# Patient Record
Sex: Female | Born: 1980 | Race: Black or African American | Hispanic: No | Marital: Single | Smoking: Never smoker
Health system: Southern US, Community
[De-identification: ages and names within clinical notes are randomized; demographics above are authoritative.]

## PROBLEM LIST (undated history)

## (undated) ENCOUNTER — Ambulatory Visit (HOSPITAL_COMMUNITY): Admission: EM | Discharge status: Absent without leave | Payer: Self-pay

## (undated) DIAGNOSIS — G4733 Obstructive sleep apnea (adult) (pediatric): Secondary | ICD-10-CM

## (undated) HISTORY — PX: TONSILLECTOMY: SUR1361

## (undated) HISTORY — PX: NASAL SEPTOPLASTY W/ TURBINOPLASTY: SHX2070

## (undated) HISTORY — DX: Obstructive sleep apnea (adult) (pediatric): G47.33

---
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
## 1998-01-06 ENCOUNTER — Inpatient Hospital Stay (HOSPITAL_COMMUNITY): Admission: AD | Admit: 1998-01-06 | Discharge: 1998-01-06 | Payer: Self-pay | Admitting: Obstetrics & Gynecology

## 1998-02-17 ENCOUNTER — Inpatient Hospital Stay (HOSPITAL_COMMUNITY): Admission: AD | Admit: 1998-02-17 | Discharge: 1998-02-17 | Payer: Self-pay | Admitting: *Deleted

## 1998-02-18 ENCOUNTER — Other Ambulatory Visit: Admission: RE | Admit: 1998-02-18 | Discharge: 1998-02-18 | Payer: Self-pay | Admitting: Obstetrics & Gynecology

## 1998-05-12 ENCOUNTER — Other Ambulatory Visit: Admission: RE | Admit: 1998-05-12 | Discharge: 1998-05-12 | Payer: Self-pay | Admitting: Obstetrics & Gynecology

## 1998-07-15 ENCOUNTER — Inpatient Hospital Stay (HOSPITAL_COMMUNITY): Admission: AD | Admit: 1998-07-15 | Discharge: 1998-07-15 | Payer: Self-pay | Admitting: *Deleted

## 1998-07-22 ENCOUNTER — Inpatient Hospital Stay (HOSPITAL_COMMUNITY): Admission: AD | Admit: 1998-07-22 | Discharge: 1998-07-25 | Payer: Self-pay | Admitting: Obstetrics & Gynecology

## 1998-07-29 ENCOUNTER — Emergency Department (HOSPITAL_COMMUNITY): Admission: EM | Admit: 1998-07-29 | Discharge: 1998-07-29 | Payer: Self-pay | Admitting: *Deleted

## 1998-09-13 ENCOUNTER — Other Ambulatory Visit: Admission: RE | Admit: 1998-09-13 | Discharge: 1998-09-13 | Payer: Self-pay | Admitting: Obstetrics and Gynecology

## 1999-04-23 ENCOUNTER — Emergency Department (HOSPITAL_COMMUNITY): Admission: EM | Admit: 1999-04-23 | Discharge: 1999-04-23 | Payer: Self-pay | Admitting: Emergency Medicine

## 1999-05-09 ENCOUNTER — Other Ambulatory Visit: Admission: RE | Admit: 1999-05-09 | Discharge: 1999-05-09 | Payer: Self-pay | Admitting: Obstetrics & Gynecology

## 1999-11-15 ENCOUNTER — Ambulatory Visit (HOSPITAL_COMMUNITY): Admission: RE | Admit: 1999-11-15 | Discharge: 1999-11-15 | Payer: Self-pay | Admitting: *Deleted

## 2001-02-07 ENCOUNTER — Other Ambulatory Visit: Admission: RE | Admit: 2001-02-07 | Discharge: 2001-02-07 | Payer: Self-pay | Admitting: Obstetrics and Gynecology

## 2001-09-14 ENCOUNTER — Emergency Department (HOSPITAL_COMMUNITY): Admission: EM | Admit: 2001-09-14 | Discharge: 2001-09-14 | Payer: Self-pay | Admitting: Emergency Medicine

## 2001-10-07 ENCOUNTER — Emergency Department (HOSPITAL_COMMUNITY): Admission: EM | Admit: 2001-10-07 | Discharge: 2001-10-07 | Payer: Self-pay | Admitting: Emergency Medicine

## 2001-11-22 ENCOUNTER — Ambulatory Visit (HOSPITAL_COMMUNITY): Admission: RE | Admit: 2001-11-22 | Discharge: 2001-11-22 | Payer: Self-pay | Admitting: Obstetrics and Gynecology

## 2002-05-14 ENCOUNTER — Other Ambulatory Visit: Admission: RE | Admit: 2002-05-14 | Discharge: 2002-05-14 | Payer: Self-pay | Admitting: Obstetrics and Gynecology

## 2002-10-14 ENCOUNTER — Encounter: Admission: RE | Admit: 2002-10-14 | Discharge: 2003-01-12 | Payer: Self-pay | Admitting: Family Medicine

## 2004-11-01 ENCOUNTER — Ambulatory Visit: Payer: Self-pay | Admitting: Internal Medicine

## 2005-01-12 ENCOUNTER — Other Ambulatory Visit: Admission: RE | Admit: 2005-01-12 | Discharge: 2005-01-12 | Payer: Self-pay | Admitting: Obstetrics and Gynecology

## 2005-04-10 HISTORY — PX: TUBAL LIGATION: SHX77

## 2006-04-05 ENCOUNTER — Ambulatory Visit: Payer: Self-pay | Admitting: Internal Medicine

## 2006-04-12 ENCOUNTER — Encounter (INDEPENDENT_AMBULATORY_CARE_PROVIDER_SITE_OTHER): Payer: Self-pay | Admitting: Internal Medicine

## 2006-04-12 ENCOUNTER — Ambulatory Visit: Payer: Self-pay | Admitting: Internal Medicine

## 2006-04-12 LAB — CONVERTED CEMR LAB: Pap Smear: NORMAL

## 2006-04-13 ENCOUNTER — Ambulatory Visit: Payer: Self-pay | Admitting: Internal Medicine

## 2006-04-16 ENCOUNTER — Ambulatory Visit: Payer: Self-pay | Admitting: Internal Medicine

## 2006-05-07 ENCOUNTER — Ambulatory Visit: Payer: Self-pay | Admitting: *Deleted

## 2006-12-24 ENCOUNTER — Encounter (INDEPENDENT_AMBULATORY_CARE_PROVIDER_SITE_OTHER): Payer: Self-pay | Admitting: Internal Medicine

## 2006-12-24 DIAGNOSIS — E282 Polycystic ovarian syndrome: Secondary | ICD-10-CM

## 2006-12-24 DIAGNOSIS — Z9189 Other specified personal risk factors, not elsewhere classified: Secondary | ICD-10-CM

## 2007-02-14 ENCOUNTER — Emergency Department (HOSPITAL_COMMUNITY): Admission: EM | Admit: 2007-02-14 | Discharge: 2007-02-14 | Payer: Self-pay | Admitting: Emergency Medicine

## 2007-04-02 DIAGNOSIS — E785 Hyperlipidemia, unspecified: Secondary | ICD-10-CM | POA: Insufficient documentation

## 2007-05-30 ENCOUNTER — Emergency Department (HOSPITAL_COMMUNITY): Admission: EM | Admit: 2007-05-30 | Discharge: 2007-05-31 | Payer: Self-pay | Admitting: Emergency Medicine

## 2008-03-13 ENCOUNTER — Emergency Department (HOSPITAL_COMMUNITY): Admission: EM | Admit: 2008-03-13 | Discharge: 2008-03-13 | Payer: Self-pay | Admitting: Emergency Medicine

## 2008-09-15 ENCOUNTER — Emergency Department (HOSPITAL_COMMUNITY): Admission: EM | Admit: 2008-09-15 | Discharge: 2008-09-15 | Payer: Self-pay | Admitting: Family Medicine

## 2010-07-18 LAB — POCT URINALYSIS DIP (DEVICE)
Glucose, UA: NEGATIVE mg/dL
Nitrite: NEGATIVE
Urobilinogen, UA: 0.2 mg/dL (ref 0.0–1.0)

## 2010-07-18 LAB — GC/CHLAMYDIA PROBE AMP, GENITAL
Chlamydia, DNA Probe: NEGATIVE
GC Probe Amp, Genital: NEGATIVE

## 2010-07-18 LAB — WET PREP, GENITAL
Trich, Wet Prep: NONE SEEN
Yeast Wet Prep HPF POC: NONE SEEN

## 2010-07-18 LAB — POCT PREGNANCY, URINE: Preg Test, Ur: NEGATIVE

## 2010-08-26 NOTE — Op Note (Signed)
NAME:  Chelsea Church, Chelsea Church                       ACCOUNT NO.:  [id number]   MEDICAL RECORD NO.:  [id number]                   PATIENT TYPE:  AMB   LOCATION:  SDC                                  FACILITY:  [facility]   PHYSICIAN:  Hal Morales, M.D.             DATE OF BIRTH:  [birth year]   DATE OF PROCEDURE:  DATE OF DISCHARGE:                                 OPERATIVE REPORT   PREOPERATIVE DIAGNOSIS:  Desire for surgical sterilization.   POSTOPERATIVE DIAGNOSIS:  1. Desire for surgical sterilization.  2. Polycystic ovaries visualized.   OPERATION:  Laparoscopic tubal cautery.   ANESTHESIA:  General endotracheal.   ESTIMATED BLOOD LOSS:  Less than 25 cc.   COMPLICATIONS:  None.   FINDINGS:  1. The uterus was normal size.  2. The ovaries bilaterally were large, glistening and suspended with     polycystic ovaries.  3. The tubes were normal bilateral.   PROCEDURE:  The patient  was taken to the operating room after appropriate  identification and placed on the operating  table. After the attainment of  adequate general anesthesia she was placed in the modified lithotomy  position. The abdomen, perineum and the vagina were prepped with multiple  layers of Betadine and a red rubber catheter used to empty the bladder. A  single-tooth tenaculum was placed on the anterior cervix. The abdomen was  draped as a sterile field. The subumbilical area was infiltrated with 0.25%  Marcaine for a total of 10 cc. Subumbilical incision was made. A Veress  cannula placed through that incision into the peritoneal cavity. A  pneumoperitoneum was created with 2.5 liters of CO2. The Veress cannula was  removed and the laparoscopic trochar placed through the incision into the  peritoneal cavity. The laparoscope was placed in the trocar sleeve. The  cautery mechanism was placed through the operating channel of the  laparoscope and the right fallopian tube identified, followed to its  fimbriated end,  then grasped at the isthmic portion and cauterized into  adjacent areas. A similar procedure was carried out with the left fallopian  tube. The above noted findings were documented and all instruments removed  from the peritoneum cavity under direct visualization as the CO2 was allowed  to escape. The subumbilical incision was closed with a subcuticular suture  of 3-0 Vicryl. A sterile dressing was applied and the single-tooth tenaculum  removed. The patient was awakened from general anesthesia and taken to the  recovery room in satisfactory condition having tolerated the procedure well  with sponge and instrument counts correct.                                               Hal Morales, M.D.    VPH/MEDQ  D:  11/22/2001  T:  11/22/2001  Job:  [id number]

## 2010-08-26 NOTE — H&P (Signed)
NAME:  Chelsea Church, Chelsea Church                       ACCOUNT NO.:  [id number]   MEDICAL RECORD NO.:  [id number]                   PATIENT TYPE:  AMB   LOCATION:  SDC                                  [facility]  [facility]   PHYSICIAN:  Elmira J. Lowell Guitar, P.A.              DATE OF BIRTH:  1981-02-23   DATE OF ADMISSION:  11/22/2001  DATE OF DISCHARGE:                                HISTORY & PHYSICAL   HISTORY OF PRESENT ILLNESS:  Chelsea Church is a 30 year old single African  American female, para 3-0-0-3 requesting laparoscopic tubal cautery for  permanent sterilization.  The patient's last menstrual was two years ago  (preceded by a one year course of Depo-Provera).  She is currently having  her amenorrhea evaluated.  Last coital activity one month ago.  After  reviewing all of the methods of contraception available to her, the patient  has decided to undergo permanent sterilization by laparoscopic tubal cautery  and has consented for the same.   PAST OBSTETRICAL HISTORY:  Gravida 3, para 3-0-0-3: in 1996, 1999, and 2000.   GYNECOLOGIC HISTORY:  Menarche 30 years old.  Prior to 2001, the patient had  regular monthly menstrual periods with severe cramping.  She does not use  any contraception.  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  The patient had a normal colposcopy in 1998; last  normal Pap smear was October 2002.  Last menstrual period two years ago as  previously mentioned.   PAST MEDICAL HISTORY:  Amenorrhea.   PAST SURGICAL HISTORY:  Negative.  The patient has never had a blood  transfusion.   FAMILY HISTORY:  Positive for migraines and hypertension.   SOCIAL HISTORY:  The patient is single.  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  Habits:  She does not use tobacco  or alcohol.   CURRENT MEDICATIONS:  1. Tetracycline 500 mg twice daily.  2. [employer] metabolic fat fighter.   ALLERGIES:  She has no known drug  allergies   REVIEW OF SYSTEMS:  Positive for scalp infection.  Otherwise negative except  as mentioned in history of present illness.   PHYSICAL EXAMINATION:  VITAL SIGNS:  Blood pressure 110/70, weight is 190-  1/2 pounds.  Height is 4 feet 11 inches tall.  NECK:  There is no adenopathy or thyromegaly.  HEART:  Regular rate and rhythm.  There is no murmur.  LUNGS:  Clear to auscultation.  There are no wheezes, rales or rhonchi.  BACK:  No CVA tenderness.  ABDOMEN:  Bowel sounds are present. It is soft, nontender.  There is no  organomegaly.  EXTREMITIES:  Without clubbing, cyanosis or edema.  PELVIC:  EGBUS is within normal limits.  Vagina is rugose.  Cervix is  nontender without lesions.  Uterus appears normal size, shape and  consistency without tenderness though exam is  limited by habitus though exam  is limited by habitus.  Adnexae without tenderness of mass.  Rectovaginal  exam without any masses or tenderness.  Urine pregnancy test is negative.   IMPRESSION:  Desire for permanent sterilization.   DISPOSITION:  A review of contraceptive methods were presented to the  patient.  The patient is adamant about not bearing any more children and has  consented to undergo laparoscopic tubal cautery.  The patient understands  the procedure involved along with its risks which include, but are not  limited to, reaction to anesthesia, damage to adjacent organs, infection and  bleeding.  The patient is scheduled for November 22, 2001, at 9 a.m. at  [facility] for laparoscopic tubal cautery.                                                       Elmira J. Adline Peals.    EJP/MEDQ  D:  11/21/2001  T:  11/21/2001  Job:  [phone]

## 2011-01-13 LAB — BASIC METABOLIC PANEL
BUN: 7 mg/dL (ref 6–23)
CO2: 23 mEq/L (ref 19–32)
Chloride: 101 mEq/L (ref 96–112)
GFR calc non Af Amer: >60 mL/min (ref >60)
Glucose, Bld: 78 mg/dL (ref 70–99)
Potassium: 3.8 mEq/L (ref 3.5–5.1)
Sodium: 136 mEq/L (ref 135–145)

## 2011-01-13 LAB — URINALYSIS, ROUTINE W REFLEX MICROSCOPIC
Bilirubin Urine: NEGATIVE
Ketones, ur: >80 mg/dL — AB
Leukocytes, UA: NEGATIVE
Nitrite: NEGATIVE
Protein, ur: 30 mg/dL — AB
Urobilinogen, UA: 1 mg/dL (ref 0.0–1.0)

## 2011-01-13 LAB — CBC
HCT: 45.2 % (ref 36.0–46.0)
Hemoglobin: 15 g/dL (ref 12.0–15.0)
MCV: 87.5 fL (ref 78.0–100.0)
RDW: 12.8 % (ref 11.5–15.5)

## 2011-01-13 LAB — DIFFERENTIAL
Basophils Absolute: 0 10*3/uL (ref 0.0–0.1)
Eosinophils Absolute: 0 10*3/uL (ref 0.0–0.7)
Eosinophils Relative: 0 % (ref 0–5)
Lymphocytes Relative: 35 % (ref 12–46)
Monocytes Absolute: 0.7 10*3/uL (ref 0.1–1.0)

## 2011-01-13 LAB — URINE MICROSCOPIC-ADD ON

## 2011-01-13 LAB — PREGNANCY, URINE: Preg Test, Ur: NEGATIVE

## 2016-09-03 ENCOUNTER — Emergency Department (HOSPITAL_COMMUNITY)
Admission: EM | Admit: 2016-09-03 | Discharge: 2016-09-03 | Disposition: A | Discharge status: Home / Self Care | Payer: Self-pay | Attending: Emergency Medicine | Admitting: Emergency Medicine

## 2016-09-03 DIAGNOSIS — G8921 Chronic pain due to trauma: Secondary | ICD-10-CM | POA: Insufficient documentation

## 2016-09-03 DIAGNOSIS — M79601 Pain in right arm: Secondary | ICD-10-CM | POA: Insufficient documentation

## 2016-09-03 DIAGNOSIS — Z79899 Other long term (current) drug therapy: Secondary | ICD-10-CM | POA: Insufficient documentation

## 2016-09-03 DIAGNOSIS — G5621 Lesion of ulnar nerve, right upper limb: Secondary | ICD-10-CM | POA: Insufficient documentation

## 2016-09-03 MED ORDER — MELOXICAM 7.5 MG PO TABS: 7.5000 mg | ORAL_TABLET | Freq: Every day | ORAL | 0 refills | Status: DC

## 2016-09-03 NOTE — ED Provider Notes (Signed)
[facility] Provider Note   CSN: [id number] Arrival date & time: 09/03/16  0413     History   Chief Complaint Right arm pain   HPI Chelsea Church is a 36 y.o. female who presents to the emergency department with right arm pain that initially began 2 years ago. She reports that at that time she sustained a fall where she hit her elbow against some stairs and has been having intermittent shooting pain from her elbow down into her wrist and fingers since that time with associated intermittent wrist throbbing and decreased sensation to her fourth and fifth fingers. She states that she intermittently has difficultly grasping objects and will drop them. Aggravating factors include driving. No alleviating factors. She reports no change to the character of the pain since that time, but reports the episodes of shooting pain have increased in frequency.   The history is provided by the patient. No language interpreter was used.    No past medical history on file.  Patient Active Problem List   Diagnosis Date Noted  . HYPERLIPIDEMIA 04/02/2007  . POLYCYSTIC OVARIES 12/24/2006  . COLPOSCOPY WITH BIOPSY, HX OF 12/24/2006    No past surgical history on file.  OB History    No data available       Home Medications    Prior to Admission medications   Medication Sig Start Date End Date Taking? Authorizing Provider  meloxicam (MOBIC) 7.5 MG tablet Take 1 tablet (7.5 mg total) by mouth daily. 09/03/16   Eh Sauseda A, PA-C    Family History No family history on file.  Social History Social History  Substance Use Topics  . Smoking status: Not on file  . Smokeless tobacco: Not on file  . Alcohol use Not on file     Allergies   Patient has no allergy information on record.   Review of Systems Review of Systems  Constitutional: Positive for activity change. Negative for chills and fever.  Respiratory: Negative for shortness of breath.   Cardiovascular: Negative for  chest pain.  Gastrointestinal: Negative for abdominal pain.  Musculoskeletal: Positive for arthralgias and myalgias. Negative for back pain.  Skin: Negative for color change, rash and wound.  Neurological: Positive for weakness and numbness. Negative for dizziness and headaches.     Physical Exam Updated Vital Signs BP 132/88 (BP Location: Left Arm)   Pulse 88   Temp 98.4 F (36.9 C) (Oral)   Resp 18   SpO2 99%   Physical Exam  Constitutional: No distress.  HENT:  Head: Normocephalic.  Eyes: Conjunctivae are normal.  Neck: Neck supple.  Cardiovascular: Normal rate and regular rhythm.  Exam reveals no gallop and no friction rub.   No murmur heard. Pulmonary/Chest: Effort normal. No respiratory distress.  Abdominal: Soft. She exhibits no distension.  Musculoskeletal: Normal range of motion. She exhibits no edema, tenderness or deformity.  Full ROM of the bilateral elbows and wrists. Decreased strength against resistance to the right shoulder with ABduction. Negative empty can test. No weakness with ADduction, flexion, or extension of the shoulder. Decreased sensation to light touch to the fourth and fifth digits of the right hand. FROM of the digits. 2+ radial pulses bilaterally. Triceps, Biceps, and brachioradialis reflexes 2+.   Neurological: She is alert.  Skin: Skin is warm. No rash noted.  Psychiatric: Her behavior is normal.  Nursing note and vitals reviewed.  ED Treatments / Results  Labs (all labs ordered are listed, but only abnormal results are  displayed) Labs Reviewed - No data to display  EKG  EKG Interpretation None       Radiology No results found.  Procedures Procedures (including critical care time)  Medications Ordered in ED Medications - No data to display   Initial Impression / Assessment and Plan / ED Course  I have reviewed the triage vital signs and the nursing notes.  Pertinent labs & imaging results that were available during my care of  the patient were reviewed by me and considered in my medical decision making (see chart for details).     Patient with chronic ulnar compression symptoms x2 years now with new symptoms of right shoulder weakness. No evidence of septic joint. X-rays negative for fracture and dislocation. Patient was followed by ortho in [place] and failed conservative treatment. She is now relocating to [place]. No red flags on exam. VSS. NAD. Will d/c the patient with a brace for comfort, antiinflammatories and a follow up to ortho.   Final Clinical Impressions(s) / ED Diagnoses   Final diagnoses:  Right arm pain    New Prescriptions Discharge Medication List as of 09/03/2016 10:15 AM    START taking these medications   Details  meloxicam (MOBIC) 7.5 MG tablet Take 1 tablet (7.5 mg total) by mouth daily., Starting Sun 09/03/2016, Print         Briza Bark A, PA-C 09/05/16 1459    Bethann Berkshire, MD 09/06/16 1255

## 2016-09-03 NOTE — ED Triage Notes (Signed)
Pt c/o right arm pain for the past week. Pain shoots from elbow to wrist and fingers. Fingers are throbbing. Pain is recurrent- it is flaring up Hx of prior right arm injury, fell down steps.

## 2016-09-03 NOTE — Discharge Instructions (Signed)
Please call to schedule a follow-up appointment from today's visit with Dr. Tana FeltsAlusio's office. Please make sure to have your records from [place] transferred to his office. You can also call the number on your discharge paperwork to get established with a primary care provider since you are new to the area. You can wear the elbow sleeve for comfort. You can also continue to apply ice to the area and take meloxicam one time per day. If you develop worsening symptoms such as weakness of the entire arm,

## 2018-04-26 ENCOUNTER — Encounter (HOSPITAL_COMMUNITY): Payer: Self-pay | Admitting: Emergency Medicine

## 2018-04-26 ENCOUNTER — Emergency Department (HOSPITAL_COMMUNITY)
Admission: EM | Admit: 2018-04-26 | Discharge: 2018-04-26 | Disposition: A | Discharge status: Home / Self Care | Payer: Self-pay | Attending: Emergency Medicine | Admitting: Emergency Medicine

## 2018-04-26 DIAGNOSIS — H209 Unspecified iridocyclitis: Secondary | ICD-10-CM | POA: Insufficient documentation

## 2018-04-26 DIAGNOSIS — Z79899 Other long term (current) drug therapy: Secondary | ICD-10-CM | POA: Insufficient documentation

## 2018-04-26 MED ORDER — TETRACAINE HCL 0.5 % OP SOLN
2.0000 [drp] | Freq: Once | OPHTHALMIC | Status: AC
  Administered 2018-04-26: 2 [drp] via OPHTHALMIC
  Filled 2018-04-26: qty 4

## 2018-04-26 MED ORDER — FLUORESCEIN SODIUM 1 MG OP STRP
1.0000 | ORAL_STRIP | Freq: Once | OPHTHALMIC | Status: AC
  Administered 2018-04-26: 1 via OPHTHALMIC
  Filled 2018-04-26: qty 1

## 2018-04-26 NOTE — ED Notes (Signed)
Patient verbalizes understanding of discharge instructions. Opportunity for questioning and answers were provided. Armband removed by staff, pt discharged from ED.  

## 2018-04-26 NOTE — ED Triage Notes (Signed)
Patient to ED c/o L eye pain since yesterday. Redness noted. Denies blurred vision.

## 2018-04-26 NOTE — ED Provider Notes (Signed)
[facility] EMERGENCY DEPARTMENT Provider Note   CSN: [id number] Arrival date & time: 04/26/18  0735     History   Chief Complaint Chief Complaint  Patient presents with  . Eye Pain    HPI Chelsea Church is a 38 y.o. female with past medical history of PCOS, presenting to the emergency department with acute onset of left eye pain and redness that began yesterday.  Patient states pain began less than 24 hours ago, states it is a sharp pain.  She has associated photophobia.  Has some intermittent tearing however no drainage.  Denies foreign body sensation, itching, or irritation sensation to the eye.  No headaches or vision changes.  She is a contact lens wearer, and wore contacts yesterday.  States in the past she had inflammation of her optic nerve, however work-up was inconclusive.  The symptoms today are very different.  No medications tried for symptoms.  The history is provided by the patient.    History reviewed. No pertinent past medical history.  Patient Active Problem List   Diagnosis Date Noted  . HYPERLIPIDEMIA 04/02/2007  . POLYCYSTIC OVARIES 12/24/2006  . COLPOSCOPY WITH BIOPSY, HX OF 12/24/2006    Past Surgical History:  Procedure Laterality Date  . TUBAL LIGATION  2007     OB History   No obstetric history on file.      Home Medications    Prior to Admission medications   Medication Sig Start Date End Date Taking? Authorizing Provider  meloxicam (MOBIC) 7.5 MG tablet Take 1 tablet (7.5 mg total) by mouth daily. 09/03/16   McDonald, Mia A, PA-C    Family History No family history on file.  Social History Social History   Tobacco Use  . Smoking status: Never Smoker  . Smokeless tobacco: Never Used  Substance Use Topics  . Alcohol use: Yes    Comment: socially  . Drug use: Never     Allergies   Patient has no known allergies.   Review of Systems Review of Systems  Eyes: Positive for photophobia, pain and redness.  Negative for itching and visual disturbance.  Neurological: Negative for headaches.     Physical Exam Updated Vital Signs BP 127/63 (BP Location: Right Arm)   Pulse 98   Temp 98.6 F (37 C) (Oral)   Resp 16   SpO2 100%   Physical Exam Vitals signs and nursing note reviewed.  Constitutional:      General: She is not in acute distress.    Appearance: She is well-developed.  HENT:     Head: Normocephalic and atraumatic.  Eyes:     General: Lids are normal.        Right eye: No foreign body.        Left eye: No foreign body.     Intraocular pressure: Right eye pressure is 18 mmHg. Left eye pressure is 16 mmHg. Measurements were taken using a handheld tonometer.    Extraocular Movements: Extraocular movements intact.     Conjunctiva/sclera:     Left eye: No exudate or hemorrhage.    Comments: Diffuse conjunctival injection to the left eye that does not spare the limbus.  There is tearing present however no discharge.  The pupil appears constricted in comparison to the right.  There is consensual photophobia to the left.  Left pupil with very minimal reaction to light.  Normal IOP bilaterally. No steamy cornea. Eye visualized under Woods lamp with fluorescein stain, no uptake noted.  Cardiovascular:     Rate and Rhythm: Normal rate.  Pulmonary:     Effort: Pulmonary effort is normal.  Neurological:     Mental Status: She is alert.  Psychiatric:        Mood and Affect: Mood normal.        Behavior: Behavior normal.      ED Treatments / Results  Labs (all labs ordered are listed, but only abnormal results are displayed) Labs Reviewed - No data to display  EKG None  Radiology No results found.  Procedures Procedures (including critical care time)  Medications Ordered in ED Medications  tetracaine (PONTOCAINE) 0.5 % ophthalmic solution 2 drop (2 drops Left Eye Given by Other 04/26/18 0905)  fluorescein ophthalmic strip 1 strip (1 strip Left Eye Given by Other 04/26/18  0905)     Initial Impression / Assessment and Plan / ED Course  I have reviewed the triage vital signs and the nursing notes.  Pertinent labs & imaging results that were available during my care of the patient were reviewed by me and considered in my medical decision making (see chart for details).     Patient presenting with left eye pain and redness since yesterday.  No foreign body sensation or vision changes.  No headache.  On exam, conjunctiva is injected, does not spare the limbus.  Pupil is constricted and minimally reactive.  There is consensual photophobia.  No fluorescein stain uptake noted.  IOP normal bilaterally.  Exam most consistent with iritis.  Patient was discussed with ophthalmologist on-call, Dr. Dione Booze, who agrees with assessment and recommends patient report to his office today for further evaluation and confirmation of diagnosis.  Patient agreeable to plan, discharged with instruction to report to the ophthalmology clinic.  Final Clinical Impressions(s) / ED Diagnoses   Final diagnoses:  Iritis of left eye    ED Discharge Orders    None       Latanja Lehenbauer, [place] N, PA-C 04/26/18 1356    Eber Hong, MD 04/27/18 1537

## 2018-04-26 NOTE — ED Notes (Signed)
Visual Acuity  Bilateral Distance: 20/25   R Distance: 20/32  L Distance: 20/25

## 2018-04-26 NOTE — Discharge Instructions (Addendum)
Go directly to the ophthalmology clinic. They are expecting you today for further evaluation of your eye.

## 2020-05-07 ENCOUNTER — Other Ambulatory Visit: Payer: Self-pay

## 2020-05-07 ENCOUNTER — Emergency Department (HOSPITAL_COMMUNITY)
Admission: EM | Admit: 2020-05-07 | Discharge: 2020-05-07 | Disposition: A | Discharge status: Home / Self Care | Payer: BC Managed Care – PPO | Attending: Emergency Medicine | Admitting: Emergency Medicine

## 2020-05-07 ENCOUNTER — Ambulatory Visit (HOSPITAL_COMMUNITY)
Admission: EM | Admit: 2020-05-07 | Discharge: 2020-05-07 | Disposition: A | Discharge status: Nursing Home (Includes ICF) | Payer: BC Managed Care – PPO | Attending: Emergency Medicine | Admitting: Emergency Medicine

## 2020-05-07 ENCOUNTER — Encounter (HOSPITAL_COMMUNITY): Payer: Self-pay

## 2020-05-07 ENCOUNTER — Encounter (HOSPITAL_COMMUNITY): Payer: Self-pay | Admitting: Emergency Medicine

## 2020-05-07 ENCOUNTER — Emergency Department (HOSPITAL_COMMUNITY): Payer: BC Managed Care – PPO

## 2020-05-07 DIAGNOSIS — R0602 Shortness of breath: Secondary | ICD-10-CM | POA: Insufficient documentation

## 2020-05-07 DIAGNOSIS — R079 Chest pain, unspecified: Secondary | ICD-10-CM

## 2020-05-07 DIAGNOSIS — R42 Dizziness and giddiness: Secondary | ICD-10-CM

## 2020-05-07 DIAGNOSIS — R071 Chest pain on breathing: Secondary | ICD-10-CM | POA: Insufficient documentation

## 2020-05-07 DIAGNOSIS — U071 COVID-19: Secondary | ICD-10-CM | POA: Diagnosis not present

## 2020-05-07 DIAGNOSIS — R059 Cough, unspecified: Secondary | ICD-10-CM | POA: Diagnosis present

## 2020-05-07 LAB — BASIC METABOLIC PANEL
Anion gap: 13 (ref 5–15)
BUN: 11 mg/dL (ref 6–20)
CO2: 24 mmol/L (ref 22–32)
Calcium: 8.9 mg/dL (ref 8.9–10.3)
Chloride: 101 mmol/L (ref 98–111)
Creatinine, Ser: 1.01 mg/dL — ABNORMAL HIGH (ref 0.44–1.00)
GFR, Estimated: >60 mL/min (ref >60)
Glucose, Bld: 97 mg/dL (ref 70–99)
Potassium: 3.5 mmol/L (ref 3.5–5.1)
Sodium: 138 mmol/L (ref 135–145)

## 2020-05-07 LAB — RESP PANEL BY RT PCR (RSV, FLU A&B, COVID)
Influenza A by PCR: NEGATIVE
Influenza B by PCR: NEGATIVE
Respiratory Syncytial Virus by PCR: NEGATIVE
SARS Coronavirus 2 by RT PCR: POSITIVE — AB

## 2020-05-07 LAB — CBC
HCT: 45.7 % (ref 36.0–46.0)
Hemoglobin: 15.4 g/dL — ABNORMAL HIGH (ref 12.0–15.0)
MCH: 27.8 pg (ref 26.0–34.0)
MCHC: 33.7 g/dL (ref 30.0–36.0)
MCV: 82.6 fL (ref 80.0–100.0)
Platelets: 315 10*3/uL (ref 150–400)
RBC: 5.53 MIL/uL — ABNORMAL HIGH (ref 3.87–5.11)
RDW: 14.3 % (ref 11.5–15.5)
WBC: 6.3 10*3/uL (ref 4.0–10.5)
nRBC: 0 % (ref 0.0–0.2)

## 2020-05-07 LAB — POC SARS CORONAVIRUS 2 AG -  ED: SARS Coronavirus 2 Ag: NEGATIVE

## 2020-05-07 MED ORDER — AZITHROMYCIN 250 MG PO TABS: 250.0000 mg | ORAL_TABLET | Freq: Every day | ORAL | 0 refills | Status: DC

## 2020-05-07 MED ORDER — ONDANSETRON 8 MG PO TBDP: 8.0000 mg | ORAL_TABLET | Freq: Three times a day (TID) | ORAL | 0 refills | Status: DC | PRN

## 2020-05-07 MED ORDER — ONDANSETRON 4 MG PO TBDP
8.0000 mg | ORAL_TABLET | Freq: Once | ORAL | Status: AC
  Administered 2020-05-07: 8 mg via ORAL
  Filled 2020-05-07: qty 2

## 2020-05-07 NOTE — ED Provider Notes (Addendum)
[facility]    CSN: [id number] Arrival date & time: 05/07/20  0800      History   Chief Complaint Chief Complaint  Patient presents with  . Shortness of Breath  . Dizziness  . Cough    HPI Chelsea Church is a 39 y.o. female.   Patient presents with shortness of breath and dizziness x1 week.  She also reports chills and dry cough.  She states her chest hurts with deep breaths.  She denies fever, chills, vomiting, diarrhea, or other symptoms.  No pertinent medical history.  The history is provided by the patient and medical records.    History reviewed. No pertinent past medical history.  Patient Active Problem List   Diagnosis Date Noted  . HYPERLIPIDEMIA 04/02/2007  . POLYCYSTIC OVARIES 12/24/2006  . COLPOSCOPY WITH BIOPSY, HX OF 12/24/2006    Past Surgical History:  Procedure Laterality Date  . TUBAL LIGATION  2007    OB History   No obstetric history on file.      Home Medications    Prior to Admission medications   Medication Sig Start Date End Date Taking? Authorizing Provider  meloxicam (MOBIC) 7.5 MG tablet Take 1 tablet (7.5 mg total) by mouth daily. 09/03/16   McDonald, Coral Else, PA-C    Family History History reviewed. No pertinent family history.  Social History Social History   Tobacco Use  . Smoking status: Never Smoker  . Smokeless tobacco: Never Used  Substance Use Topics  . Alcohol use: Yes    Comment: socially  . Drug use: Never     Allergies   Patient has no known allergies.   Review of Systems Review of Systems  Constitutional: Positive for chills. Negative for fever.  HENT: Negative for ear pain and sore throat.   Eyes: Negative for pain and visual disturbance.  Respiratory: Positive for cough and shortness of breath.   Cardiovascular: Positive for chest pain. Negative for palpitations.  Gastrointestinal: Negative for abdominal pain, diarrhea and vomiting.  Genitourinary: Negative for dysuria and hematuria.   Musculoskeletal: Negative for arthralgias and back pain.  Skin: Negative for color change and rash.  Neurological: Positive for dizziness. Negative for syncope, weakness and numbness.  All other systems reviewed and are negative.    Physical Exam Triage Vital Signs ED Triage Vitals  Enc Vitals Group     BP      Pulse      Resp      Temp      Temp src      SpO2      Weight      Height      Head Circumference      Peak Flow      Pain Score      Pain Loc      Pain Edu?      Excl. in GC?    Orthostatic VS for the past 24 hrs:  BP- Lying Pulse- Lying BP- Sitting Pulse- Sitting BP- Standing at 0 minutes Pulse- Standing at 0 minutes  05/07/20 0824 108/71 96 103/70 101 104/60 118    Updated Vital Signs BP (!) 100/48   Pulse (!) 103   Temp 98.5 F (36.9 C) (Oral)   Resp 18   Ht 4\' 9"  (1.448 m)   Wt 200 lb (90.7 kg)   LMP 05/06/2020 (Exact Date)   SpO2 100%   BMI 43.28 kg/m   Visual Acuity Right Eye Distance:   Left Eye Distance:  Bilateral Distance:    Right Eye Near:   Left Eye Near:    Bilateral Near:     Physical Exam Vitals and nursing note reviewed.  Constitutional:      General: She is not in acute distress.    Appearance: She is well-developed and well-nourished. She is ill-appearing.     Comments: Lying back in wheelchair. States she is having difficulty breathing. O2 sat 100%.  She states she is too dizzy to stand up or walk.  She drove herself here but states she is unable to transport herself to the ED.  HENT:     Head: Normocephalic and atraumatic.     Mouth/Throat:     Mouth: Mucous membranes are moist.  Eyes:     Conjunctiva/sclera: Conjunctivae normal.  Cardiovascular:     Rate and Rhythm: Regular rhythm. Tachycardia present.     Heart sounds: Normal heart sounds.  Pulmonary:     Effort: Pulmonary effort is normal. No respiratory distress.     Breath sounds: Normal breath sounds. No wheezing, rhonchi or rales.  Abdominal:      Palpations: Abdomen is soft.     Tenderness: There is no abdominal tenderness. There is no guarding or rebound.  Musculoskeletal:        General: No edema.     Cervical back: Neck supple.  Skin:    General: Skin is warm and dry.     Findings: No rash.  Neurological:     General: No focal deficit present.     Mental Status: She is alert and oriented to person, place, and time.     Sensory: No sensory deficit.     Motor: No weakness.     Gait: Gait abnormal.     Comments: In wheelchair  Psychiatric:        Mood and Affect: Mood and affect and mood normal.        Behavior: Behavior normal.      UC Treatments / Results  Labs (all labs ordered are listed, but only abnormal results are displayed) Labs Reviewed - No data to display  EKG   Radiology No results found.  Procedures Procedures (including critical care time)  Medications Ordered in UC Medications - No data to display  Initial Impression / Assessment and Plan / UC Course  I have reviewed the triage vital signs and the nursing notes.  Pertinent labs & imaging results that were available during my care of the patient were reviewed by me and considered in my medical decision making (see chart for details).   Dizziness, shortness of breath, chest pain.  EKG shows sinus tachycardia, rate 101, no ST elevation, no previous to compare.  Patient states she is in acute distress.  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  Sending to the ED via EMS.   Final Clinical Impressions(s) / UC Diagnoses   Final diagnoses:  Dizziness  Shortness of breath  Chest pain, unspecified type   Discharge Instructions   None    ED Prescriptions    None     PDMP not reviewed this encounter.   Mickie Bail, NP 05/07/20 0849    Mickie Bail, NP 05/07/20 [phone]

## 2020-05-07 NOTE — ED Notes (Addendum)
Called to room by CMA who was conducting ortho static vital signs. Pt was standing, c/o increased lightheadedness, tachypneic, with BP 108/71. Provider notified of pt status and provider at [facility] for eval. EKG ordered and performed. Pt remained tachypneic, alert, oriented x4.  Pt denies CP, calf pain. C/o recent congestion, runny nose, nausea, weakness.

## 2020-05-07 NOTE — ED Notes (Addendum)
Pt ambulated in room. Pt became weak & stated she felt she was going to "fade out" after about 4 times around triage room. O2 maintained above 96% on room air. Pt denied SOB.   While ambulating  Lowest= 96%RA Highest= 97%RA Denied SOB + "Weakness"

## 2020-05-07 NOTE — ED Notes (Signed)
EMS called for non-emergent transport to ED for higher level care/evaluation. Same explained to pt.

## 2020-05-07 NOTE — ED Notes (Signed)
EDP (Dr. Rosalia Hammers) notified of ambulating o2 sats.

## 2020-05-07 NOTE — ED Notes (Signed)
Pt given h2o to drink for fluid challenge.

## 2020-05-07 NOTE — ED Triage Notes (Signed)
Pt arrives via [facility] from UC for c/o sob, sore throat and dizziness upon standing. Positive for orthostatics while at UC, 12 lead unremarkable there. EMS BP 94/60, HR 100 NSR, RR 22, 100% ra. Pt declined cbg with ems.

## 2020-05-07 NOTE — ED Triage Notes (Signed)
Pt c/o of dizziness every time she walks or and stands. She states the SOB and dizziness have been going on for 6 days. Pt denies chest pain. Pt states her muscles hurt and she feels her lungs are tight.

## 2020-05-07 NOTE — ED Notes (Signed)
Patient is being discharged from the Urgent Care and sent to the Emergency Department via EMS . Per K. Arlana Pouch, NP patient is in need of higher level of care due to SOB, dizziness, hypotension, nausea Patient is aware and verbalizes understanding of plan of care.  Vitals:   05/07/20 0817 05/07/20 0841  BP: (!) 100/48   Pulse:    Resp:    Temp:  98.5 F (36.9 C)  SpO2:

## 2020-05-07 NOTE — ED Triage Notes (Signed)
Pt states she has been coughing and having chills since Sunday. She states her mouth is dry often.

## 2020-05-07 NOTE — ED Triage Notes (Addendum)
Emergency Medicine Provider Triage Evaluation Note  Chelsea Church , a 40 y.o. female  was evaluated in triage.  Pt complains of cough, pain with breathing, dyspnea, rhinnorhea, fever, and chills with muscle aches.  Review of Systems  Positive: See hpi Negative: Vomiting, diarrhea  Physical Exam  BP 115/61   Pulse 96   Temp 99.7 F (37.6 C) (Oral)   Resp 18   Ht 1.448 m (4\' 9" )   Wt 90.7 kg   LMP 05/06/2020 (Exact Date)   SpO2 97%   BMI 43.28 kg/m  Gen:   Awake, no distress   HEENT:  Atraumatic  Resp:  Normal effort  Cardiac:  Normal rate  Abd:   Nondistended, nontender  MSK:   Moves extremities without difficulty  Neuro:  Speech clear   Medical Decision Making  Medically screening exam initiated at 10:10 AM.  Appropriate orders placed.  CATHERYN SLIFER was informed that the remainder of the evaluation will be completed by another provider, this initial triage assessment does not replace that evaluation, and the importance of remaining in the ED until their evaluation is complete. Results for orders placed or performed during the hospital encounter of 05/07/20  CBC  Result Value Ref Range   WBC 6.3 4.0 - 10.5 K/uL   RBC 5.53 (H) 3.87 - 5.11 MIL/uL   Hemoglobin 15.4 (H) 12.0 - 15.0 g/dL   HCT 05/09/20 74.0 - 81.4 %   MCV 82.6 80.0 - 100.0 fL   MCH 27.8 26.0 - 34.0 pg   MCHC 33.7 30.0 - 36.0 g/dL   RDW 48.1 85.6 - 31.4 %   Platelets 315 150 - 400 K/uL   nRBC 0.0 0.0 - 0.2 %  POC SARS Coronavirus 2 Ag-ED - Nasal Swab  Result Value Ref Range   SARS Coronavirus 2 Ag NEGATIVE NEGATIVE   Clinical Impression  Plan poc covid test and ambulate with pulse ox zofran and oral fluid trial   POC covid test negative CXR with some subtle bibasilar opacities SAts remained 97% with ambulating Oral fluid challenge being give after zofran but patient has not been vomiting. Plan discharge to home with zpack for possible cap Discussed home treatment, need to follow up on pcr test,  and return precautions.  97.0, MD 05/07/20 1011    05/09/20, MD 05/07/20 1040

## 2020-05-07 NOTE — Discharge Instructions (Signed)
Please drink plenty of fluids and continue tylenol and ibuprofen for fever and muscle aches.  Take all of your antibiotic and use zofran as needed for nausea. Follow up covid and flu test on my chart. Return if you are worse at any time.  Do not return to work until you have no fever for 48 hours or, if covid positive, follow cdc covid reccomendations

## 2021-01-04 ENCOUNTER — Ambulatory Visit (INDEPENDENT_AMBULATORY_CARE_PROVIDER_SITE_OTHER): Payer: 59 | Admitting: Podiatry

## 2021-01-04 ENCOUNTER — Other Ambulatory Visit: Payer: Self-pay

## 2021-01-04 DIAGNOSIS — B351 Tinea unguium: Secondary | ICD-10-CM | POA: Diagnosis not present

## 2021-01-04 MED ORDER — TERBINAFINE HCL 250 MG PO TABS: 250.0000 mg | ORAL_TABLET | Freq: Every day | ORAL | 0 refills | Status: AC

## 2021-01-05 NOTE — Progress Notes (Signed)
  Subjective:  Patient ID: Chelsea Church, female    DOB: 1980-07-27,  MRN: [id number]  No chief complaint on file.   40 y.o. female presents with the above complaint. History confirmed with patient.  She complains of discolored bilateral hallux toenails and yellow discoloration  Objective:  Physical Exam: warm, good capillary refill, no trophic changes or ulcerative lesions, normal DP and PT pulses, normal sensory exam, and onychomycosis.    Assessment:  No diagnosis found.   Plan:  Patient was evaluated and treated and all questions answered.  Discussed etiology and treatment options of onychomycosis.  I recommend treatment terbinafine in a day course.  Photographs were taken follow-up in 4 months after treatment.  Return in about 3 months (around 04/05/2021) for follow up after nail fungus treatment.

## 2021-01-18 ENCOUNTER — Encounter: Payer: Self-pay | Admitting: Podiatry

## 2021-03-23 IMAGING — CR DG CHEST 2V
2 series · 2 of 2 positions shown · non-contrast
Comparison: 03/13/2008

CLINICAL DATA: Shortness of breath.  Chills.

EXAM:
CHEST - 2 VIEW

[chest pa]
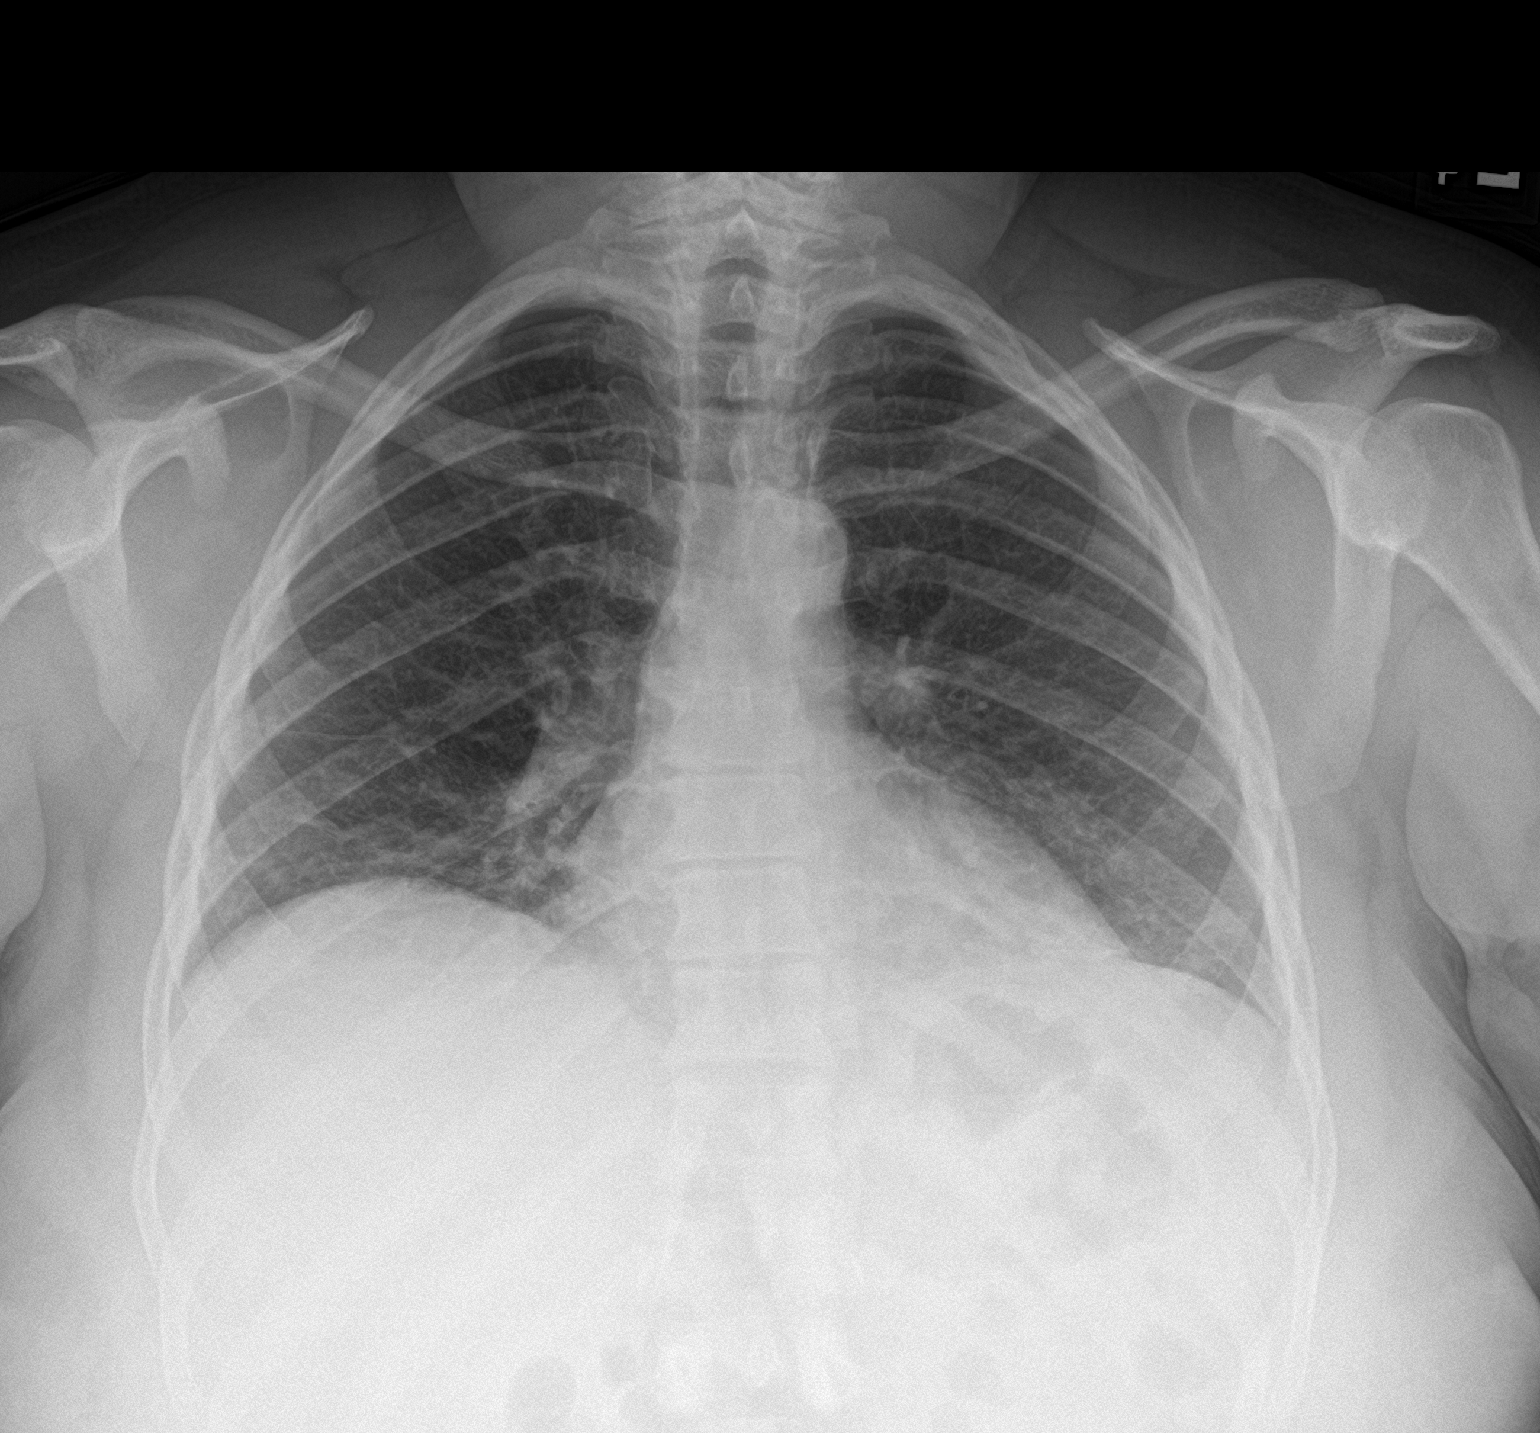

[chest lat]
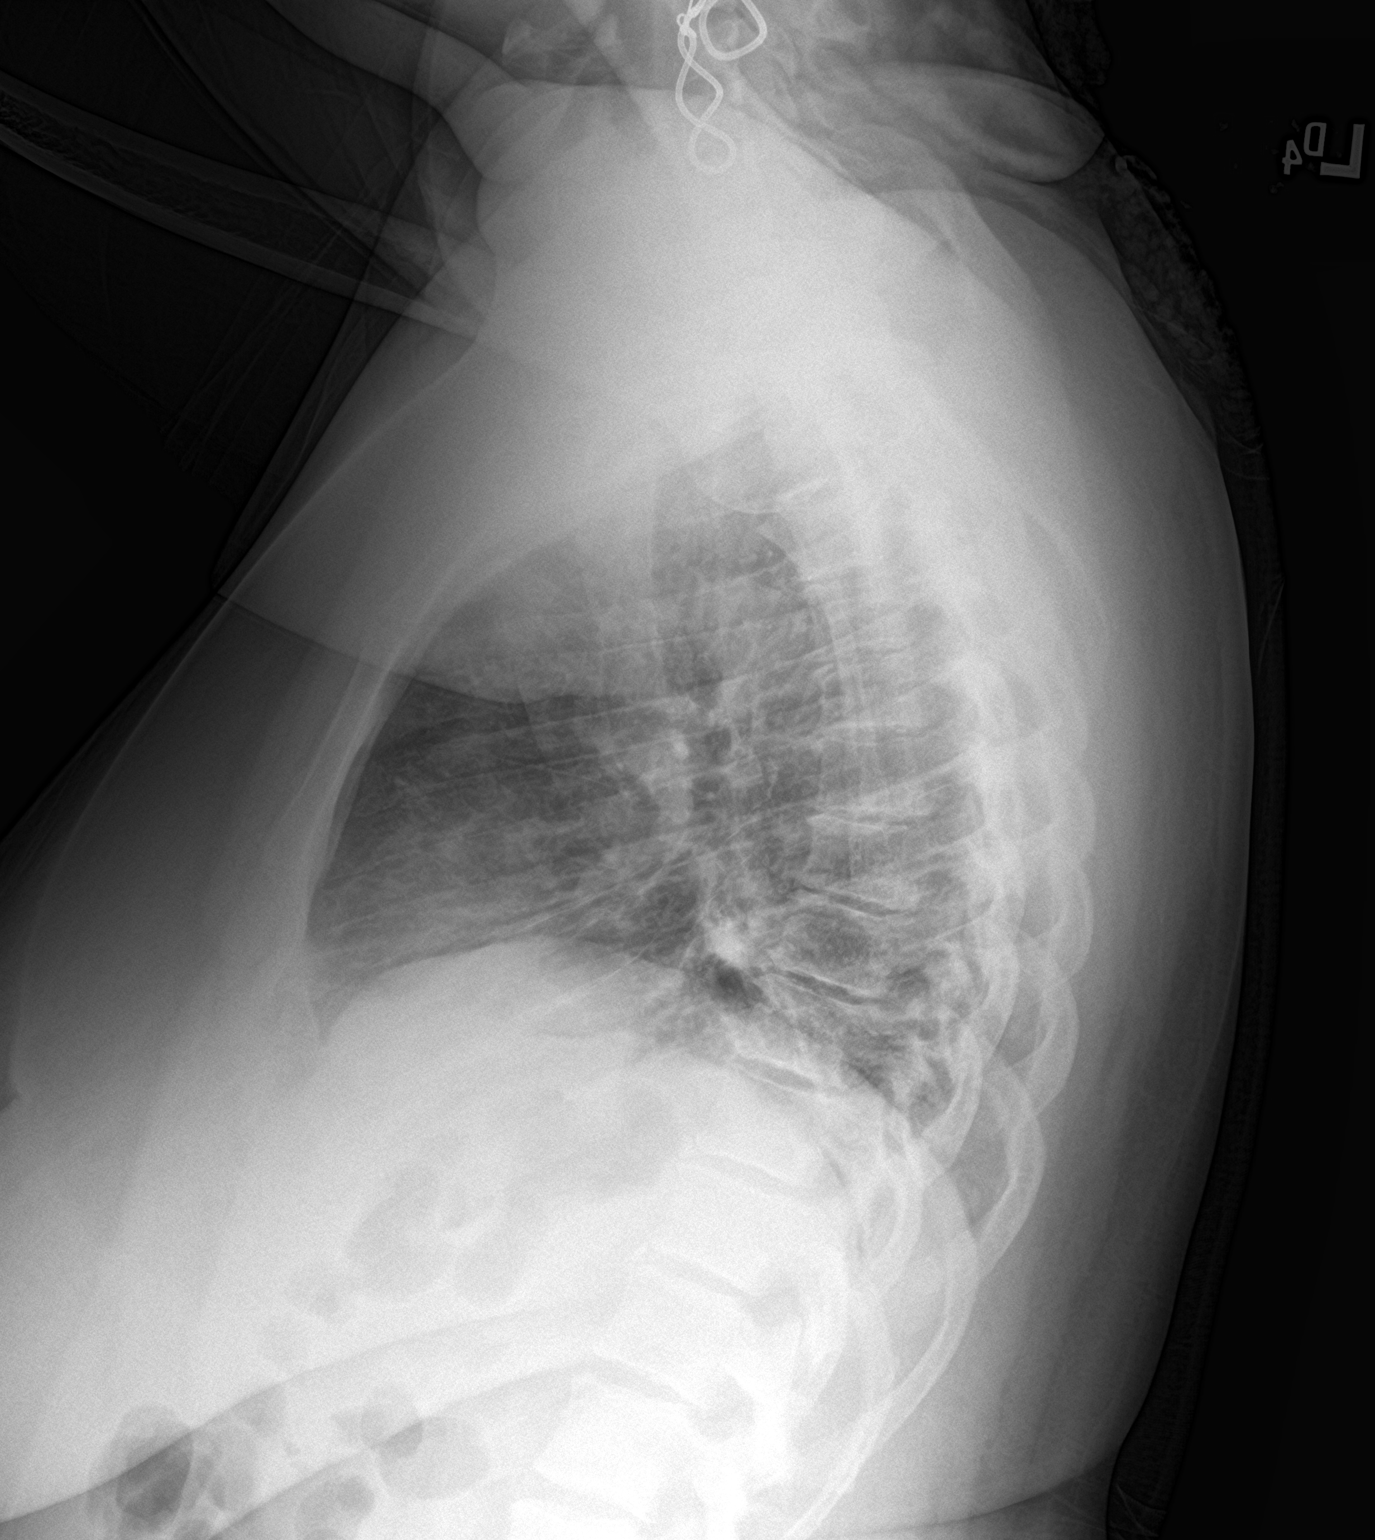

[2 of 2 positions shown; findings below may reference images not displayed]

FINDINGS: Lateral view degraded by patient arm position. Midline trachea.
Normal heart size. No pleural effusion or pneumothorax. Low lung
volumes. Suggestion of basilar predominant interstitial opacities.
IMPRESSION: Low lung volumes with subtle bibasilar interstitial opacities.
Although this could simply represent atelectasis due to poor
inspiratory effort, infection, including atypical/viral/[id number]
etiology could look similar.

## 2021-05-05 ENCOUNTER — Ambulatory Visit: Payer: 59 | Admitting: Podiatry

## 2021-05-17 ENCOUNTER — Other Ambulatory Visit: Payer: Self-pay

## 2021-05-17 ENCOUNTER — Ambulatory Visit (INDEPENDENT_AMBULATORY_CARE_PROVIDER_SITE_OTHER): Payer: Managed Care, Other (non HMO) | Admitting: Podiatry

## 2021-05-17 DIAGNOSIS — B351 Tinea unguium: Secondary | ICD-10-CM

## 2021-05-19 NOTE — Progress Notes (Signed)
°  Subjective:  Patient ID: Chelsea Church, female    DOB: Sep 17, 1980,  MRN: [id number]  Chief Complaint  Patient presents with   Nail Problem       67m follow up after nail fungus treatment    41 y.o. female presents with the above complaint. History confirmed with patient.  She started taking the Lamisil and noted improvement but had to be taken off of it in early December due to hemorrhoid bleeding.  She would like to restart the medication.  Objective:  Physical Exam: warm, good capillary refill, no trophic changes or ulcerative lesions, normal DP and PT pulses, normal sensory exam, and onychomycosis.       Assessment:   1. Onychomycosis      Plan:  Patient was evaluated and treated and all questions answered.  Discussed etiology and treatment options of onychomycosis.  I think it would be reasonable to restart terbinafine.  She shows about 20% improvement even from a short amount as she had taken it.  She still has pills left from the last prescription she will take these and let me know if she is ready for refill.  I will see her back in 4 months for reevaluation.  Return in about 4 months (around 09/14/2021) for follow up after nail fungus treatment.

## 2021-08-16 ENCOUNTER — Other Ambulatory Visit: Payer: Self-pay | Admitting: Internal Medicine

## 2021-08-16 DIAGNOSIS — R0600 Dyspnea, unspecified: Secondary | ICD-10-CM

## 2021-08-17 ENCOUNTER — Ambulatory Visit (INDEPENDENT_AMBULATORY_CARE_PROVIDER_SITE_OTHER): Payer: Managed Care, Other (non HMO) | Admitting: Internal Medicine

## 2021-08-17 DIAGNOSIS — R0600 Dyspnea, unspecified: Secondary | ICD-10-CM | POA: Diagnosis not present

## 2021-08-17 LAB — PULMONARY FUNCTION TEST
DL/VA % pred: 121 %
DL/VA: 5.61 ml/min/mmHg/L
DLCO cor % pred: 106 %
DLCO cor: 18.53 ml/min/mmHg
DLCO unc % pred: 106 %
DLCO unc: 18.53 ml/min/mmHg
FEF 25-75 Post: 2.94 L/sec
FEF 25-75 Pre: 2.45 L/sec
FEF2575-%Change-Post: 20 %
FEF2575-%Pred-Post: 127 %
FEF2575-%Pred-Pre: 105 %
FEV1-%Change-Post: 2 %
FEV1-%Pred-Post: 100 %
FEV1-%Pred-Pre: 97 %
FEV1-Post: 1.97 L
FEV1-Pre: 1.92 L
FEV1FVC-%Change-Post: 4 %
FEV1FVC-%Pred-Pre: 105 %
FEV6-%Change-Post: -1 %
FEV6-%Pred-Post: 92 %
FEV6-%Pred-Pre: 94 %
FEV6-Post: 2.15 L
FEV6-Pre: 2.19 L
FEV6FVC-%Pred-Post: 102 %
FEV6FVC-%Pred-Pre: 102 %
FVC-%Change-Post: -1 %
FVC-%Pred-Post: 90 %
FVC-%Pred-Pre: 91 %
FVC-Post: 2.15 L
FVC-Pre: 2.19 L
Post FEV1/FVC ratio: 92 %
Post FEV6/FVC ratio: 100 %
Pre FEV1/FVC ratio: 88 %
Pre FEV6/FVC Ratio: 100 %

## 2021-08-17 NOTE — Progress Notes (Signed)
PFT done today. 

## 2021-09-13 ENCOUNTER — Ambulatory Visit: Payer: Managed Care, Other (non HMO) | Admitting: Internal Medicine

## 2021-09-13 ENCOUNTER — Encounter: Payer: Self-pay | Admitting: Internal Medicine

## 2021-09-13 VITALS — BP 128/72 | HR 99 | Temp 98.2°F | Wt 201.0 lb

## 2021-09-13 DIAGNOSIS — J4530 Mild persistent asthma, uncomplicated: Secondary | ICD-10-CM

## 2021-09-13 DIAGNOSIS — R0602 Shortness of breath: Secondary | ICD-10-CM | POA: Diagnosis not present

## 2021-09-13 DIAGNOSIS — U071 COVID-19: Secondary | ICD-10-CM | POA: Diagnosis not present

## 2021-09-13 MED ORDER — BUDESONIDE-FORMOTEROL FUMARATE 160-4.5 MCG/ACT IN AERO: 2.0000 | INHALATION_SPRAY | Freq: Two times a day (BID) | RESPIRATORY_TRACT | 5 refills | Status: DC

## 2021-09-13 MED ORDER — ALBUTEROL SULFATE HFA 108 (90 BASE) MCG/ACT IN AERS: 2.0000 | INHALATION_SPRAY | Freq: Four times a day (QID) | RESPIRATORY_TRACT | 5 refills | Status: AC | PRN

## 2021-09-13 NOTE — Patient Instructions (Addendum)
Please schedule follow up scheduled with myself in 2 months.  If my schedule is not open yet, we will contact you with a reminder closer to that time. Please call [phone] if you haven't heard from [place] a month before.   Try symbicort inhaler 2 puffs twice a day, gargle after use Take the albuterol rescue inhaler every 4 to 6 hours as needed for wheezing or shortness of breath. You can also take it 15 minutes before exercise or exertional activity. Side effects include heart racing or pounding, jitters or anxiety. If you have a history of an irregular heart rhythm, it can make this worse. Can also give some patients a hard time sleeping.  To inhale the aerosol using an inhaler, follow these steps:  Remove the protective dust cap from the end of the mouthpiece. If the dust cap was not placed on the mouthpiece, check the mouthpiece for dirt or other objects. Be sure that the canister is fully and firmly inserted in the mouthpiece. 2. If you are using the inhaler for the first time or if you have not used the inhaler in more than 14 days, you will need to prime it. You may also need to prime the inhaler if it has been dropped. Ask your pharmacist or check the manufacturer's information if this happens. To prime the inhaler, shake it well and then press down on the canister 4 times to release 4 sprays into the air, away from your face. Be careful not to get albuterol in your eyes. 3. Shake the inhaler well. 4. Breathe out as completely as possible through your mouth. 4. Hold the canister with the mouthpiece on the bottom, facing you and the canister pointing upward. Place the open end of the mouthpiece into your mouth. Close your lips tightly around the mouthpiece. 6. Breathe in slowly and deeply through the mouthpiece.At the same time, press down once on the container to spray the medication into your mouth. 7. Try to hold your breath for 10 seconds. remove the inhaler, and breathe out slowly. 8. If  you were told to use 2 puffs, wait 1 minute and then repeat steps 3-7. 9. Replace the protective cap on the inhaler. 10. Clean your inhaler regularly. Follow the manufacturer's directions carefully and ask your doctor or pharmacist if you have any questions about cleaning your inhaler.  Check the back of the inhaler to keep track of the total number of doses left on the inhaler.

## 2021-09-13 NOTE — Progress Notes (Unsigned)
Chelsea Church    [id number]    November 17, 1980  Primary Care Physician:Mulberry, Lanora Manis, MD  Referring Physician: Ollen Bowl, MD [address] [place],  [place] [address] Reason for Consultation: shortness of breath Date of Consultation: 09/14/2021  Chief complaint:   Chief Complaint  Patient presents with   Consult    Pt ishere for consult for SOB when she walks. She states she already did her breathing test. Pt is not taking any inahlers or current medications. Pt is not on any oxygen. She does wear CPAP at night SOB started to occur after she got COVID last September.      HPI: Chelsea Church is a 41 y.o. woman who presents for new patient evaluation of shortness of breath.  Had covid in August 2021. Has had covid two times before this. Did not have any treatment.  Back then she had fatigue, sharp pains in her legs, shortness of breath, muscle fatigue.   Now she has dyspnea with exertion, racing heart rate chest tightness. Has a very active job climbing up ladders, raking leaves, picking up tree branches.  At home she can do ADLS but has to stop and rest. Denies coughing.   Does not take any inhalers.   No childhood respiratory disease. Sister has asthma.   She has OSA and is on CPAP since 2012.   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  Exposures: lives at home.  Smoking history: never smoker, has had passive smoke exposure in childhood both parents.   Social History   Occupational History   Not on file  Tobacco Use   Smoking status: Never   Smokeless tobacco: Never  Substance and Sexual Activity   Alcohol use: Yes    Comment: socially   Drug use: Never   Sexual activity: Not on file    Relevant family history:  Family History  Problem Relation Age of Onset   Asthma Sister     Past Medical History:  Diagnosis Date   OSA (obstructive sleep apnea)      Past Surgical History:  Procedure Laterality Date   NASAL SEPTOPLASTY W/ TURBINOPLASTY     TONSILLECTOMY     TUBAL LIGATION  2007     Physical Exam: Blood pressure 128/72, pulse 99, temperature 98.2 F (36.8 C), temperature source Oral, weight 201 lb (91.2 kg), SpO2 99 %. Gen:      No acute distress ENT:  no nasal polyps, mucus membranes moist Lungs:    No increased respiratory effort, symmetric chest wall excursion, clear to auscultation bilaterally, no wheezes or crackles CV:         Regular rate and rhythm; no murmurs, rubs, or gallops.  No pedal edema Abd:      + bowel sounds; soft, non-tender; no distension MSK: no acute synovitis of DIP or PIP joints, no mechanics hands.  Skin:      Warm and dry; no rashes Neuro: normal speech, no focal facial asymmetry Psych: alert and oriented x3, normal mood and affect   Data Reviewed/Medical Decision Making:  Independent interpretation of tests: Imaging:  Review of patient's chest xray Jan 2022 images revealed no acute process. The patient's images have been independently reviewed by me.    PFTs: I have personally reviewed the patient's PFTs and normal pulmonary function.     Latest Ref Rng & Units 08/17/2021  3:44 PM  PFT Results  FVC-Pre L 2.19    FVC-Predicted Pre % 91    FVC-Post L 2.15    FVC-Predicted Post % 90    Pre FEV1/FVC % % 88    Post FEV1/FCV % % 92    FEV1-Pre L 1.92    FEV1-Predicted Pre % 97    FEV1-Post L 1.97    DLCO uncorrected ml/min/mmHg 18.53    DLCO UNC% % 106    DLCO corrected ml/min/mmHg 18.53    DLCO COR %Predicted % 106    DLVA Predicted % 121      Labs:  Lab Results  Component Value Date   WBC 6.3 05/07/2020   HGB 15.4 (H) 05/07/2020   HCT 45.7 05/07/2020   MCV 82.6 05/07/2020   PLT 315 05/07/2020   Lab Results  Component Value Date   NA 138 05/07/2020   K 3.5 05/07/2020   CL 101 05/07/2020   CO2 24 05/07/2020     Immunization status:  Immunization History   Administered Date(s) Administered   Td 04/12/2006   Tdap 06/10/2016     I reviewed prior external note(s) from primary care, ED visit  I reviewed the result(s) of the labs and imaging as noted above.   I have ordered    Assessment:  Shortness of breath Recent covid infection OSA on CPAP  Plan/Recommendations: Normal pulmonary function. Discussed results with patient. However the shortness of breath could still be related to activation of asthma or reactive airways following covid infection. Trial of ICS-LABA with prn albuterol. Symptoms could also be related to long-covid. I will see her back after a trial of this.   We discussed disease management and progression at length today.   I spent 45 minutes in the care of this patient today including pre-charting, chart review, review of results, face-to-face care, coordination of care and communication with consultants etc.).  Return to Care: Return in about 2 months (around 11/13/2021).  Durel Salts, MD Pulmonary and Critical Care Medicine [facility] Office:[phone]  CC: Ollen Bowl, MD

## 2021-09-14 ENCOUNTER — Telehealth: Payer: Self-pay | Admitting: Internal Medicine

## 2021-09-14 DIAGNOSIS — J4530 Mild persistent asthma, uncomplicated: Secondary | ICD-10-CM

## 2021-09-14 NOTE — Telephone Encounter (Signed)
Called patient and she states that the pharmacy said something about her insurance not covering her symbicort.   Can we see if we need a PA for this medication?   Or what else would be covered if not?

## 2021-09-14 NOTE — Telephone Encounter (Signed)
Ms. Chelsea Church is in urgent need of a call regarding medications that were called in on 6/6. Pt states she received 2 of the same medications. Patient states that she would like to  switch the pharmacy due the lack of communication New pharmacy would be [facility] in [place] moving forward. [name] phone number is [phone].

## 2021-09-15 ENCOUNTER — Other Ambulatory Visit (HOSPITAL_COMMUNITY): Payer: Self-pay

## 2021-09-15 MED ORDER — BUDESONIDE-FORMOTEROL FUMARATE 160-4.5 MCG/ACT IN AERO: 2.0000 | INHALATION_SPRAY | Freq: Two times a day (BID) | RESPIRATORY_TRACT | 5 refills | Status: AC

## 2021-11-14 ENCOUNTER — Ambulatory Visit: Payer: Managed Care, Other (non HMO) | Admitting: Internal Medicine

## 2022-12-27 ENCOUNTER — Other Ambulatory Visit: Payer: Self-pay

## 2022-12-27 MED ORDER — WEGOVY 0.25 MG/0.5ML ~~LOC~~ SOAJ
0.2500 mg | SUBCUTANEOUS | 1 refills | Status: AC
  Filled 2022-12-27: qty 2, 28d supply, fill #0
  Filled 2023-01-18: qty 2, 28d supply, fill #1
  Filled ????-??-?? (×2): fill #1

## 2022-12-27 MED ORDER — MELOXICAM 15 MG PO TABS
15.0000 mg | ORAL_TABLET | Freq: Every day | ORAL | 1 refills | Status: DC
  Filled 2022-12-27: qty 30, 30d supply, fill #0
  Filled 2023-03-22: qty 30, 30d supply, fill #1

## 2022-12-28 ENCOUNTER — Other Ambulatory Visit: Payer: Self-pay

## 2022-12-28 MED ORDER — EUCRISA 2 % EX OINT
TOPICAL_OINTMENT | Freq: Every day | CUTANEOUS | 2 refills | Status: AC | PRN
  Filled 2022-12-28: qty 60, 30d supply, fill #0

## 2022-12-28 MED ORDER — KETOCONAZOLE 2 % EX CREA
TOPICAL_CREAM | Freq: Every day | CUTANEOUS | 2 refills | Status: AC | PRN
  Filled 2022-12-28: qty 30, 30d supply, fill #0
  Filled 2023-08-28: qty 30, 30d supply, fill #1

## 2022-12-28 MED ORDER — FLUOCINOLONE ACETONIDE SCALP 0.01 % EX OIL
TOPICAL_OIL | CUTANEOUS | 5 refills | Status: DC
  Filled 2022-12-28 – 2023-01-02 (×2): qty 118.28, 30d supply, fill #0
  Filled 2023-03-22: qty 118.28, 30d supply, fill #1
  Filled 2023-06-14: qty 118.28, 30d supply, fill #2
  Filled 2023-08-28: qty 118.28, 30d supply, fill #3
  Filled 2023-10-31: qty 118.28, 30d supply, fill #4

## 2022-12-29 ENCOUNTER — Other Ambulatory Visit: Payer: Self-pay

## 2023-01-02 ENCOUNTER — Other Ambulatory Visit: Payer: Self-pay

## 2023-01-15 ENCOUNTER — Other Ambulatory Visit: Payer: Self-pay

## 2023-01-16 ENCOUNTER — Other Ambulatory Visit: Payer: Self-pay

## 2023-01-19 ENCOUNTER — Other Ambulatory Visit: Payer: Self-pay

## 2023-01-22 ENCOUNTER — Other Ambulatory Visit: Payer: Self-pay

## 2023-01-22 MED ORDER — WEGOVY 1.7 MG/0.75ML ~~LOC~~ SOAJ
1.7000 mg | SUBCUTANEOUS | 1 refills | Status: DC
  Filled 2023-01-23: qty 3, 30d supply, fill #0

## 2023-01-22 MED ORDER — SEMAGLUTIDE-WEIGHT MANAGEMENT 1.7 MG/0.75ML ~~LOC~~ SOAJ
1.7000 mg | SUBCUTANEOUS | 0 refills | Status: DC
  Filled 2023-05-21: qty 3, 28d supply, fill #0

## 2023-01-23 ENCOUNTER — Other Ambulatory Visit: Payer: Self-pay

## 2023-01-23 MED ORDER — WEGOVY 0.5 MG/0.5ML ~~LOC~~ SOAJ
0.5000 mg | SUBCUTANEOUS | 0 refills | Status: AC
  Filled 2023-01-23: qty 2, 28d supply, fill #0

## 2023-01-24 ENCOUNTER — Other Ambulatory Visit: Payer: Self-pay

## 2023-01-25 ENCOUNTER — Other Ambulatory Visit: Payer: Self-pay

## 2023-01-29 ENCOUNTER — Other Ambulatory Visit: Payer: Self-pay

## 2023-02-13 ENCOUNTER — Other Ambulatory Visit: Payer: Self-pay

## 2023-02-14 ENCOUNTER — Other Ambulatory Visit: Payer: Self-pay

## 2023-02-14 MED ORDER — WEGOVY 1 MG/0.5ML ~~LOC~~ SOAJ
0.5000 mL | SUBCUTANEOUS | 0 refills | Status: DC
  Filled 2023-02-14: qty 2, 28d supply, fill #0

## 2023-02-21 ENCOUNTER — Other Ambulatory Visit: Payer: Self-pay

## 2023-03-22 ENCOUNTER — Other Ambulatory Visit: Payer: Self-pay

## 2023-03-22 MED ORDER — WEGOVY 1 MG/0.5ML ~~LOC~~ SOAJ
0.5000 mL | SUBCUTANEOUS | 1 refills | Status: AC
  Filled 2023-03-22: qty 2, 28d supply, fill #0
  Filled 2023-04-20: qty 2, 28d supply, fill #1

## 2023-04-20 ENCOUNTER — Other Ambulatory Visit: Payer: Self-pay

## 2023-05-21 ENCOUNTER — Other Ambulatory Visit: Payer: Self-pay

## 2023-05-22 ENCOUNTER — Other Ambulatory Visit: Payer: Self-pay

## 2023-06-14 ENCOUNTER — Other Ambulatory Visit: Payer: Self-pay

## 2023-06-14 MED ORDER — WEGOVY 1 MG/0.5ML ~~LOC~~ SOAJ
1.0000 mg | SUBCUTANEOUS | 1 refills | Status: AC
  Filled 2023-06-14: qty 2, 28d supply, fill #0

## 2023-06-18 ENCOUNTER — Other Ambulatory Visit: Payer: Self-pay

## 2023-06-20 ENCOUNTER — Other Ambulatory Visit: Payer: Self-pay

## 2023-06-20 MED ORDER — WEGOVY 1.7 MG/0.75ML ~~LOC~~ SOAJ
1.7000 mg | SUBCUTANEOUS | 0 refills | Status: AC
  Filled 2023-06-20: qty 3, 28d supply, fill #0

## 2023-06-26 ENCOUNTER — Other Ambulatory Visit: Payer: Self-pay

## 2023-08-28 ENCOUNTER — Other Ambulatory Visit: Payer: Self-pay

## 2023-08-28 ENCOUNTER — Other Ambulatory Visit (HOSPITAL_COMMUNITY): Payer: Self-pay

## 2023-08-29 ENCOUNTER — Other Ambulatory Visit: Payer: Self-pay

## 2023-08-29 MED ORDER — MELOXICAM 15 MG PO TABS
15.0000 mg | ORAL_TABLET | Freq: Every day | ORAL | 1 refills | Status: AC
  Filled 2023-08-29: qty 30, 30d supply, fill #0

## 2023-08-31 ENCOUNTER — Other Ambulatory Visit: Payer: Self-pay

## 2023-10-31 ENCOUNTER — Other Ambulatory Visit: Payer: Self-pay

## 2023-10-31 MED ORDER — BUDESONIDE-FORMOTEROL FUMARATE 160-4.5 MCG/ACT IN AERO
2.0000 | INHALATION_SPRAY | Freq: Two times a day (BID) | RESPIRATORY_TRACT | 1 refills | Status: AC
  Filled 2023-10-31: qty 10.2, 30d supply, fill #0

## 2023-10-31 MED ORDER — ALBUTEROL SULFATE HFA 108 (90 BASE) MCG/ACT IN AERS
1.0000 | INHALATION_SPRAY | Freq: Four times a day (QID) | RESPIRATORY_TRACT | 1 refills | Status: AC
  Filled 2023-10-31: qty 8.5, 50d supply, fill #0

## 2023-10-31 MED ORDER — ZEPBOUND 2.5 MG/0.5ML ~~LOC~~ SOAJ
2.5000 mg | SUBCUTANEOUS | 1 refills | Status: AC
  Filled 2023-10-31: qty 2, 28d supply, fill #0
  Filled 2023-11-23 – 2023-11-27 (×3): qty 2, 28d supply, fill #1

## 2023-11-01 ENCOUNTER — Other Ambulatory Visit: Payer: Self-pay

## 2023-11-02 ENCOUNTER — Other Ambulatory Visit: Payer: Self-pay

## 2023-11-03 ENCOUNTER — Other Ambulatory Visit: Payer: Self-pay

## 2023-11-05 ENCOUNTER — Other Ambulatory Visit: Payer: Self-pay

## 2023-11-06 ENCOUNTER — Other Ambulatory Visit: Payer: Self-pay

## 2023-11-13 ENCOUNTER — Other Ambulatory Visit: Payer: Self-pay

## 2023-11-13 MED ORDER — ALBUTEROL SULFATE HFA 108 (90 BASE) MCG/ACT IN AERS
1.0000 | INHALATION_SPRAY | Freq: Four times a day (QID) | RESPIRATORY_TRACT | 1 refills | Status: AC
  Filled 2023-11-13: qty 8.5, 50d supply, fill #0

## 2023-11-20 ENCOUNTER — Other Ambulatory Visit: Payer: Self-pay

## 2023-11-20 MED ORDER — BUDESONIDE-FORMOTEROL FUMARATE 160-4.5 MCG/ACT IN AERO
2.0000 | INHALATION_SPRAY | Freq: Two times a day (BID) | RESPIRATORY_TRACT | 1 refills | Status: AC
  Filled 2023-11-20: qty 10.2, 30d supply, fill #0

## 2023-11-23 ENCOUNTER — Other Ambulatory Visit: Payer: Self-pay

## 2023-11-23 MED ORDER — ZEPBOUND 2.5 MG/0.5ML ~~LOC~~ SOAJ: 0.5000 mL | SUBCUTANEOUS | 1 refills | Status: AC

## 2023-11-27 ENCOUNTER — Other Ambulatory Visit: Payer: Self-pay

## 2023-11-28 ENCOUNTER — Other Ambulatory Visit: Payer: Self-pay

## 2023-12-03 ENCOUNTER — Other Ambulatory Visit: Payer: Self-pay

## 2023-12-04 ENCOUNTER — Other Ambulatory Visit: Payer: Self-pay

## 2023-12-04 MED ORDER — OMEPRAZOLE 40 MG PO CPDR
40.0000 mg | DELAYED_RELEASE_CAPSULE | Freq: Two times a day (BID) | ORAL | 3 refills | Status: AC
  Filled 2023-12-04: qty 60, 30d supply, fill #0

## 2023-12-04 MED ORDER — FLUTICASONE PROPIONATE 50 MCG/ACT NA SUSP
2.0000 | Freq: Every day | NASAL | 11 refills | Status: AC
  Filled 2023-12-04: qty 16, 30d supply, fill #0

## 2023-12-20 ENCOUNTER — Other Ambulatory Visit: Payer: Self-pay

## 2023-12-20 MED ORDER — ZEPBOUND 5 MG/0.5ML ~~LOC~~ SOAJ
5.0000 mg | SUBCUTANEOUS | 1 refills | Status: AC
  Filled 2023-12-20: qty 2, 28d supply, fill #0

## 2023-12-25 ENCOUNTER — Other Ambulatory Visit (HOSPITAL_COMMUNITY): Payer: Self-pay

## 2023-12-25 MED ORDER — SODIUM FLUORIDE 1.1 % DT CREA
TOPICAL_CREAM | DENTAL | 2 refills | Status: AC
  Filled 2023-12-25 – 2024-01-14 (×4): qty 51, 30d supply, fill #0

## 2023-12-26 ENCOUNTER — Other Ambulatory Visit: Payer: Self-pay

## 2023-12-26 MED ORDER — CICLOPIROX 8 % EX SOLN
1.0000 | Freq: Every day | CUTANEOUS | 0 refills | Status: AC
  Filled 2023-12-26: qty 6.6, 30d supply, fill #0

## 2024-01-01 ENCOUNTER — Other Ambulatory Visit: Payer: Self-pay

## 2024-01-02 ENCOUNTER — Other Ambulatory Visit: Payer: Self-pay

## 2024-01-02 ENCOUNTER — Other Ambulatory Visit (HOSPITAL_COMMUNITY): Payer: Self-pay

## 2024-01-09 ENCOUNTER — Other Ambulatory Visit: Payer: Self-pay

## 2024-01-10 ENCOUNTER — Other Ambulatory Visit: Payer: Self-pay

## 2024-01-11 ENCOUNTER — Other Ambulatory Visit: Payer: Self-pay

## 2024-01-14 ENCOUNTER — Other Ambulatory Visit: Payer: Self-pay

## 2024-01-16 ENCOUNTER — Other Ambulatory Visit: Payer: Self-pay

## 2024-01-16 MED ORDER — ZEPBOUND 5 MG/0.5ML ~~LOC~~ SOAJ
5.0000 mg | SUBCUTANEOUS | 1 refills | Status: AC
  Filled 2024-01-16: qty 2, 28d supply, fill #0

## 2024-01-21 ENCOUNTER — Other Ambulatory Visit: Payer: Self-pay

## 2024-01-25 ENCOUNTER — Other Ambulatory Visit: Payer: Self-pay

## 2024-01-25 MED ORDER — FAMOTIDINE 40 MG PO TABS
20.0000 mg | ORAL_TABLET | Freq: Two times a day (BID) | ORAL | 5 refills | Status: AC
  Filled 2024-01-25: qty 60, 60d supply, fill #0

## 2024-01-25 MED ORDER — OMEPRAZOLE 40 MG PO CPDR
40.0000 mg | DELAYED_RELEASE_CAPSULE | Freq: Two times a day (BID) | ORAL | 5 refills | Status: AC
  Filled 2024-01-25: qty 60, 30d supply, fill #0

## 2024-01-25 MED FILL — Sod Sulfate-Pot Sulf-Mg Sulf Oral Sol 17.5-3.13-1.6 GM/177ML: ORAL | 2 days supply | Qty: 354 | Fill #0 | Status: AC

## 2024-02-05 ENCOUNTER — Emergency Department (HOSPITAL_BASED_OUTPATIENT_CLINIC_OR_DEPARTMENT_OTHER)
Admission: EM | Admit: 2024-02-05 | Discharge: 2024-02-05 | Disposition: A | Discharge status: Home / Self Care | Attending: Emergency Medicine | Admitting: Emergency Medicine

## 2024-02-05 ENCOUNTER — Other Ambulatory Visit: Payer: Self-pay

## 2024-02-05 ENCOUNTER — Emergency Department (HOSPITAL_BASED_OUTPATIENT_CLINIC_OR_DEPARTMENT_OTHER)

## 2024-02-05 DIAGNOSIS — E162 Hypoglycemia, unspecified: Secondary | ICD-10-CM | POA: Insufficient documentation

## 2024-02-05 DIAGNOSIS — K2950 Unspecified chronic gastritis without bleeding: Secondary | ICD-10-CM | POA: Insufficient documentation

## 2024-02-05 DIAGNOSIS — R1084 Generalized abdominal pain: Secondary | ICD-10-CM | POA: Diagnosis present

## 2024-02-05 LAB — CBC WITH DIFFERENTIAL/PLATELET
Abs Immature Granulocytes: 0.01 K/uL (ref 0.00–0.07)
Basophils Absolute: 0 K/uL (ref 0.0–0.1)
Basophils Relative: 0 %
Eosinophils Absolute: 0.1 K/uL (ref 0.0–0.5)
Eosinophils Relative: 1 %
HCT: 41.2 % (ref 36.0–46.0)
Hemoglobin: 13.5 g/dL (ref 12.0–15.0)
Immature Granulocytes: 0 %
Lymphocytes Relative: 40 %
Lymphs Abs: 2.4 K/uL (ref 0.7–4.0)
MCH: 26.6 pg (ref 26.0–34.0)
MCHC: 32.8 g/dL (ref 30.0–36.0)
MCV: 81.1 fL (ref 80.0–100.0)
Monocytes Absolute: 0.5 K/uL (ref 0.1–1.0)
Monocytes Relative: 8 %
Neutro Abs: 3.1 K/uL (ref 1.7–7.7)
Neutrophils Relative %: 51 %
Platelets: 434 K/uL — ABNORMAL HIGH (ref 150–400)
RBC: 5.08 MIL/uL (ref 3.87–5.11)
RDW: 15.4 % (ref 11.5–15.5)
WBC: 6 K/uL (ref 4.0–10.5)
nRBC: 0 % (ref 0.0–0.2)

## 2024-02-05 LAB — COMPREHENSIVE METABOLIC PANEL WITH GFR
ALT: 11 U/L (ref 0–44)
AST: 16 U/L (ref 15–41)
Albumin: 4.3 g/dL (ref 3.5–5.0)
Alkaline Phosphatase: 68 U/L (ref 38–126)
Anion gap: 9 (ref 5–15)
BUN: 7 mg/dL (ref 6–20)
CO2: 24 mmol/L (ref 22–32)
Calcium: 9.3 mg/dL (ref 8.9–10.3)
Chloride: 106 mmol/L (ref 98–111)
Creatinine, Ser: 0.96 mg/dL (ref 0.44–1.00)
GFR, Estimated: >60 mL/min (ref >60)
Glucose, Bld: 67 mg/dL — ABNORMAL LOW (ref 70–99)
Potassium: 3.6 mmol/L (ref 3.5–5.1)
Sodium: 140 mmol/L (ref 135–145)
Total Bilirubin: 0.4 mg/dL (ref 0.0–1.2)
Total Protein: 7.3 g/dL (ref 6.5–8.1)

## 2024-02-05 LAB — URINALYSIS, ROUTINE W REFLEX MICROSCOPIC
Bilirubin Urine: NEGATIVE
Glucose, UA: NEGATIVE mg/dL
Hgb urine dipstick: NEGATIVE
Ketones, ur: NEGATIVE mg/dL
Nitrite: NEGATIVE
Specific Gravity, Urine: 1.033 — ABNORMAL HIGH (ref 1.005–1.030)
pH: 6.5 (ref 5.0–8.0)

## 2024-02-05 LAB — HCG, SERUM, QUALITATIVE: Preg, Serum: NEGATIVE

## 2024-02-05 LAB — LIPASE, BLOOD: Lipase: 30 U/L (ref 11–51)

## 2024-02-05 MED ORDER — ALUM & MAG HYDROXIDE-SIMETH 200-200-20 MG/5ML PO SUSP
30.0000 mL | Freq: Once | ORAL | Status: AC
  Administered 2024-02-05: 30 mL via ORAL
  Filled 2024-02-05: qty 30

## 2024-02-05 MED ORDER — ONDANSETRON HCL 4 MG/2ML IJ SOLN
4.0000 mg | Freq: Once | INTRAMUSCULAR | Status: AC
  Administered 2024-02-05: 4 mg via INTRAVENOUS
  Filled 2024-02-05: qty 2

## 2024-02-05 MED ORDER — AMOXICILLIN 500 MG PO CAPS
500.0000 mg | ORAL_CAPSULE | Freq: Two times a day (BID) | ORAL | 0 refills | Status: AC
  Filled 2024-02-05: qty 20, 10d supply, fill #0

## 2024-02-05 MED ORDER — IOHEXOL 300 MG/ML  SOLN
100.0000 mL | Freq: Once | INTRAMUSCULAR | Status: AC | PRN
  Administered 2024-02-05: 100 mL via INTRAVENOUS

## 2024-02-05 MED ORDER — CLARITHROMYCIN 500 MG PO TABS
500.0000 mg | ORAL_TABLET | Freq: Two times a day (BID) | ORAL | 0 refills | Status: AC
  Filled 2024-02-05: qty 20, 10d supply, fill #0

## 2024-02-05 MED ORDER — LIDOCAINE VISCOUS HCL 2 % MT SOLN
15.0000 mL | Freq: Once | OROMUCOSAL | Status: AC
  Administered 2024-02-05: 15 mL via ORAL
  Filled 2024-02-05: qty 15

## 2024-02-05 NOTE — ED Provider Notes (Signed)
 [place] EMERGENCY DEPARTMENT AT [facility] Provider Note   CSN: [id number] Arrival date & time: 02/05/24  1141     Patient presents with: Abdominal Pain   Chelsea Church is a 43 y.o. female.  Patient presents to the emergency department today with concerns of hyperlipidemia, tubal ligation, and OSA here with concerns for abdominal pain.  Reports that she has been experiencing generalized abdominal pain for about 2 weeks and has been seen by ENT and GI and is currently being treated for acid reflux with omeprazole  and Pepcid.  Denies vomiting or diarrhea but does endorse some nausea.  States that pain is worse after sneezing and is somewhat intermittent in nature and does not notice any close ties with pain worsening before or after meals.  No fever, chills, cough, congestion, sore throat, or bodyaches.   Abdominal Pain      Prior to Admission medications   Medication Sig Start Date End Date Taking? Authorizing Provider  amoxicillin (AMOXIL) 500 MG capsule Take 1 capsule (500 mg total) by mouth 2 (two) times daily for 10 days. 02/05/24 02/15/24 Yes Vinie Charity A, PA-C  clarithromycin (BIAXIN) 500 MG tablet Take 1 tablet (500 mg total) by mouth 2 (two) times daily for 10 days. 02/05/24 02/15/24 Yes Maylon Sailors A, PA-C  albuterol  (VENTOLIN  HFA) 108 (90 Base) MCG/ACT inhaler Inhale 2 puffs into the lungs every 6 (six) hours as needed. 09/13/21   Meade Verdon RAMAN, MD  albuterol  (VENTOLIN  HFA) [phone] Base) MCG/ACT inhaler Inhale 1 puff into the lungs every 6 (six) hours. 10/31/23     albuterol  (VENTOLIN  HFA) 108 (90 Base) MCG/ACT inhaler Inhale 1 puff into the lungs every 6 (six) hours as needed. 11/13/23     budesonide -formoterol  (SYMBICORT ) 160-4.5 MCG/ACT inhaler Inhale 2 puffs into the lungs in the morning and at bedtime. 09/15/21   Desai, Nikita S, MD  budesonide -formoterol  (SYMBICORT ) 160-4.5 MCG/ACT inhaler Inhale 2 puffs into the lungs 2 (two) times daily. 10/31/23      budesonide -formoterol  (SYMBICORT ) 160-4.5 MCG/ACT inhaler Inhale 2 puffs into the lungs in the morning and at bedtime. 11/20/23     ciclopirox  (PENLAC ) 8 % solution Apply 1 Application externally daily. 12/26/23     EUCRISA  2 % OINT Apply to eyelid daily as needed for irritation 12/28/22     famotidine (PEPCID) 40 MG tablet Take 0.5 tablets (20 mg total) by mouth 2 (two) times daily. 01/25/24     fluticasone  (FLONASE ) 50 MCG/ACT nasal spray Place 2 sprays into both nostrils daily. 12/04/23     ketoconazole  (NIZORAL ) 2 % cream Apply to eyelids daily as needed for irritation. 12/28/22     meloxicam  (MOBIC ) 15 MG tablet Take 1 tablet (15 mg total) by mouth daily. 08/29/23     Na Sulfate-K Sulfate-Mg Sulfate concentrate (SUPREP) 17.5-3.13-1.6 GM/177ML SOLN Take as directed. 01/25/24     omeprazole  (PRILOSEC) 40 MG capsule Take 1 capsule (40 mg total) by mouth 2 (two) times daily before a meal. (Morning and evening) 12/04/23     omeprazole  (PRILOSEC) 40 MG capsule Take 1 capsule (40 mg total) by mouth 2 (two) times daily before a meal. 01/25/24     Semaglutide -Weight Management (WEGOVY ) 0.25 MG/0.5ML SOAJ Inject 0.25 mg (0.86mL) into the skin once a week. 12/27/22     Semaglutide -Weight Management (WEGOVY ) 0.5 MG/0.5ML SOAJ Inject 0.5 mg into the skin once a week. 01/23/23     Semaglutide -Weight Management (WEGOVY ) 1 MG/0.5ML SOAJ Inject 0.5 mL into the skin  once a week. 03/22/23     Semaglutide -Weight Management (WEGOVY ) 1 MG/0.5ML SOAJ Inject 1 mg into the skin once a week. 06/14/23     Semaglutide -Weight Management (WEGOVY ) 1.7 MG/0.75ML SOAJ Inject 1.7 mg into the skin once a week. 06/20/23     sodium fluoride  (PREVIDENT 5000 PLUS) 1.1 % CREA dental cream Apply 1 small amount to teeth once a day.  Brush 1 minute then expectorate. 12/25/23     tirzepatide  (ZEPBOUND ) 2.5 MG/0.5ML Pen Inject 2.5 mg into the skin once a week. 10/31/23     tirzepatide  (ZEPBOUND ) 2.5 MG/0.5ML Pen Inject 2.5 mg into the skin once a  week. 11/23/23     tirzepatide  (ZEPBOUND ) 5 MG/0.5ML Pen Inject 5 mg into the skin once a week. 12/20/23     tirzepatide  (ZEPBOUND ) 5 MG/0.5ML Pen Inject 5 mg into the skin once a week. 01/16/24       Allergies: Patient has no known allergies.    Review of Systems  Gastrointestinal:  Positive for abdominal pain.  All other systems reviewed and are negative.   Updated Vital Signs BP 125/89   Pulse 76   Temp 98.6 F (37 C) (Oral)   Resp 16   SpO2 100%   Physical Exam Vitals and nursing note reviewed.  Constitutional:      General: She is not in acute distress.    Appearance: She is well-developed.  HENT:     Head: Normocephalic and atraumatic.  Eyes:     Conjunctiva/sclera: Conjunctivae normal.  Cardiovascular:     Rate and Rhythm: Normal rate and regular rhythm.     Heart sounds: No murmur heard. Pulmonary:     Effort: Pulmonary effort is normal. No respiratory distress.     Breath sounds: Normal breath sounds.  Abdominal:     General: Bowel sounds are normal.     Palpations: Abdomen is soft. There is no hepatomegaly or mass.     Tenderness: There is generalized abdominal tenderness. There is no guarding or rebound.  Musculoskeletal:        General: No swelling.     Cervical back: Neck supple.  Skin:    General: Skin is warm and dry.     Capillary Refill: Capillary refill takes less than 2 seconds.  Neurological:     Mental Status: She is alert.  Psychiatric:        Mood and Affect: Mood normal.     (all labs ordered are listed, but only abnormal results are displayed) Labs Reviewed  CBC WITH DIFFERENTIAL/PLATELET - Abnormal; Notable for the following components:      Result Value   Platelets 434 (*)    All other components within normal limits  COMPREHENSIVE METABOLIC PANEL WITH GFR - Abnormal; Notable for the following components:   Glucose, Bld 67 (*)    All other components within normal limits  URINALYSIS, ROUTINE W REFLEX MICROSCOPIC - Abnormal; Notable  for the following components:   Specific Gravity, Urine 1.033 (*)    Protein, ur TRACE (*)    Leukocytes,Ua TRACE (*)    Bacteria, UA RARE (*)    All other components within normal limits  LIPASE, BLOOD  HCG, SERUM, QUALITATIVE    EKG: None  Radiology: CT ABDOMEN PELVIS W CONTRAST Result Date: 02/05/2024 CLINICAL DATA:  Abdominal pain. EXAM: CT ABDOMEN AND PELVIS WITH CONTRAST TECHNIQUE: Multidetector CT imaging of the abdomen and pelvis was performed using the standard protocol following bolus administration of intravenous contrast. RADIATION DOSE REDUCTION:  This exam was performed according to the departmental dose-optimization program which includes automated exposure control, adjustment of the mA and/or kV according to patient size and/or use of iterative reconstruction technique. CONTRAST:  100mL OMNIPAQUE IOHEXOL 300 MG/ML  SOLN COMPARISON:  None Available. FINDINGS: Lower chest: The visualized lung bases are clear. No intra-abdominal free air or free fluid. Hepatobiliary: The liver is unremarkable. No biliary ductal dilatation. The gallbladder is unremarkable. Pancreas: Unremarkable. No pancreatic ductal dilatation or surrounding inflammatory changes. Spleen: Normal in size without focal abnormality. Adrenals/Urinary Tract: The adrenal glands unremarkable. The kidneys, visualized ureters, and urinary bladder appear unremarkable. Stomach/Bowel: There is sigmoid diverticulosis and scattered colonic diverticula. There is no bowel obstruction or active inflammation. The appendix is normal. Vascular/Lymphatic: The abdominal aorta and IVC unremarkable. No portal venous gas. There is no adenopathy. Reproductive: The uterus is anteverted. Small each may be fibroids noted. No suspicious adnexal masses. Other: Post procedural changes of liposuction in the anterior abdominal wall. No fluid collection. Musculoskeletal: No acute or significant osseous findings. IMPRESSION: 1. No acute intra-abdominal or  pelvic pathology. 2. Colonic diverticulosis. No bowel obstruction. Normal appendix. Electronically Signed   By: Vanetta Chou M.D.   On: 02/05/2024 14:53     Procedures   Medications Ordered in the ED  ondansetron  (ZOFRAN ) injection 4 mg (4 mg Intravenous Given 02/05/24 1305)  alum & mag hydroxide-simeth (MAALOX/MYLANTA) 200-200-20 MG/5ML suspension 30 mL (30 mLs Oral Given 02/05/24 1305)    And  lidocaine (XYLOCAINE) 2 % viscous mouth solution 15 mL (15 mLs Oral Given 02/05/24 1305)  iohexol (OMNIPAQUE) 300 MG/ML solution 100 mL (100 mLs Intravenous Contrast Given 02/05/24 1424)                                    Medical Decision Making Amount and/or Complexity of Data Reviewed Labs: ordered. Radiology: ordered.  Risk OTC drugs. Prescription drug management.   This patient presents to the ED for concern of abdominal pain, this involves an extensive number of treatment options, and is a complaint that carries with it a high risk of complications and morbidity.  The differential diagnosis includes GERD, H. pylori infection, indigestion, esophageal spasm, pancreatitis, bowel obstruction, Crohn's disease   Co morbidities that complicate the patient evaluation  Hyperlipidemia, OSA, prior tubularization   Additional history obtained:  Additional history obtained from epic chart review   Lab Tests:  I Ordered, and personally interpreted labs.  The pertinent results include: CBC unremarkable, CMP slight hypoglycemia at 67, lipase normal at 30, UA without signs of infection   Imaging Studies ordered:  I ordered imaging studies including CT abdomen pelvis I independently visualized and interpreted imaging which showed no acute intra-abdominal or pelvic pathology. 2. Colonic diverticulosis. No bowel obstruction. Normal appendix. I agree with the radiologist interpretation   Consultations Obtained:  I requested consultation with none,  and discussed lab and imaging  findings as well as pertinent plan - they recommend: N/A   Problem List / ED Course / Critical interventions / Medication management  Patient presented to the emergency department today with concerns of generalized abdominal pain for 2 months.  Reports being seen by ENT and GI and has been told that she likely has poorly controlled GERD.  She is currently on omeprazole  twice daily.  He has been on this for the last 2 months without significant improvement.  Denies vomiting or diarrhea but does endorse some  nausea.  She states that she has been having some increasing generalized abdominal pain that is present without any clear correlation to eating or drinking. Physical exam reveals generalized abdominal tenderness.  No focal area of guarding.  No appreciable mass.  Will initiate with labs and imaging for further assessment.  GI cocktail initiated given patient's reported GERD history. Labs unremarkable.  UA without signs of infection.  No obvious indications of any hepatic, renal, or pancreatic abnormality to explain symptoms.  CT abdomen pelvis negative. Reassessed patient and she reports improvement after GI cocktail.  Suspect this is likely gastritis related.  Given prolonged course of symptoms without improvement after PPI monotherapy, initiate triple therapy including clarithromycin and amoxicillin for possible H. pylori.  Advised patient to follow-up closely with GI for further assessment.  Return precautions discussed.  He is otherwise stable for outpatient follow-up and discharged home. I ordered medication including Maalox, viscous lidocaine, Zofran  for GI cocktail, nausea Reevaluation of the patient after these medicines showed that the patient improved I have reviewed the patients home medicines and have made adjustments as needed   Social Determinants of Health:  None   Test / Admission - Considered:  Admission considered but patient stable for outpatient follow-up.  Final  diagnoses:  Chronic gastritis, presence of bleeding unspecified, unspecified gastritis type    ED Discharge Orders          Ordered    clarithromycin (BIAXIN) 500 MG tablet  2 times daily        02/05/24 1547    amoxicillin (AMOXIL) 500 MG capsule  2 times daily        02/05/24 1547               Bowie Doiron A, PA-C 02/05/24 1631    Cottie Donnice PARAS, MD 02/05/24 [phone]

## 2024-02-05 NOTE — ED Notes (Signed)
 Discharge instructions, follow up care, and prescriptions reviewed and explained, pt verbalized understanding and had no further questions on d/c. Pt caox4, ambulatory NAD on d/c.

## 2024-02-05 NOTE — ED Triage Notes (Signed)
 Reports generalized abd pain x 2 weeks. Seen by GI and treated fro reflux. Denies n/v/d. States pain is worse when sneezing.

## 2024-02-05 NOTE — ED Notes (Signed)
 Spoke with lab about hCG qualitative add on

## 2024-02-05 NOTE — Discharge Instructions (Signed)
 You are seen in the emergency department today for concerns of abdominal pain.  Your labs and imaging were reassuring without any obvious findings to explain your symptoms.  Based on your findings, I do have concerns that may be experiencing gastritis potentially due to H. Pylori. Since we have no way of testing for this in the ER, we have decided to start treatment empirically but this may not fully clear up your symptoms. I would recommend following up closely with your care team including gastroenterology for further evaluation.

## 2024-02-06 ENCOUNTER — Other Ambulatory Visit: Payer: Self-pay

## 2024-05-16 ENCOUNTER — Encounter (HOSPITAL_BASED_OUTPATIENT_CLINIC_OR_DEPARTMENT_OTHER): Payer: Self-pay

## 2024-05-16 ENCOUNTER — Emergency Department (HOSPITAL_BASED_OUTPATIENT_CLINIC_OR_DEPARTMENT_OTHER): Admission: EM | Admit: 2024-05-16 | Source: Home / Self Care

## 2024-05-16 ENCOUNTER — Other Ambulatory Visit: Payer: Self-pay

## 2024-05-16 MED ORDER — NA SULFATE-K SULFATE-MG SULF 17.5-3.13-1.6 GM/177ML PO SOLN
ORAL | 0 refills | Status: AC
  Filled 2024-05-16: qty 354, 1d supply, fill #0
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
